# Patient Record
Sex: Male | Born: 1975
Health system: Southern US, Community
[De-identification: ages and names within clinical notes are randomized; demographics above are authoritative.]

## PROBLEM LIST (undated history)

## (undated) DIAGNOSIS — T8859XA Other complications of anesthesia, initial encounter: Secondary | ICD-10-CM

## (undated) DIAGNOSIS — S0005XA Superficial foreign body of scalp, initial encounter: Secondary | ICD-10-CM

## (undated) DIAGNOSIS — E119 Type 2 diabetes mellitus without complications: Secondary | ICD-10-CM

## (undated) DIAGNOSIS — F419 Anxiety disorder, unspecified: Secondary | ICD-10-CM

## (undated) HISTORY — DX: Anxiety disorder, unspecified: F41.9

## (undated) HISTORY — PX: DENTAL SURGERY: SHX609

## (undated) HISTORY — DX: Type 2 diabetes mellitus without complications: E11.9

---

## 2003-07-30 HISTORY — PX: ANKLE SURGERY: SHX546

## 2016-09-20 ENCOUNTER — Encounter: Payer: Self-pay | Admitting: Pediatrics

## 2016-09-20 ENCOUNTER — Ambulatory Visit (INDEPENDENT_AMBULATORY_CARE_PROVIDER_SITE_OTHER): Payer: Medicaid Other | Admitting: Pediatrics

## 2016-09-20 VITALS — BP 130/85 | HR 90 | Temp 97.6°F | Ht 71.0 in | Wt 259.2 lb

## 2016-09-20 DIAGNOSIS — E109 Type 1 diabetes mellitus without complications: Secondary | ICD-10-CM

## 2016-09-20 DIAGNOSIS — F411 Generalized anxiety disorder: Secondary | ICD-10-CM

## 2016-09-20 LAB — BAYER DCA HB A1C WAIVED: HB A1C (BAYER DCA - WAIVED): 6.9 % (ref <7.0)

## 2016-09-20 MED ORDER — INSULIN GLARGINE 100 UNIT/ML ~~LOC~~ SOLN: 30.0000 [IU] | Freq: Two times a day (BID) | SUBCUTANEOUS | 4 refills | Status: DC

## 2016-09-20 MED ORDER — GLUCOSE BLOOD VI STRP: ORAL_STRIP | 12 refills | Status: DC

## 2016-09-20 MED ORDER — ALPRAZOLAM 1 MG PO TABS: 1.0000 mg | ORAL_TABLET | Freq: Three times a day (TID) | ORAL | 2 refills | Status: DC | PRN

## 2016-09-20 MED ORDER — INSULIN LISPRO 100 UNIT/ML ~~LOC~~ SOLN: SUBCUTANEOUS | 3 refills | Status: DC

## 2016-09-20 NOTE — Progress Notes (Signed)
Subjective:   Patient ID: Gary Stone, male    DOB: September 09, 1975, 41 y.o.   MRN: [id number] CC: New Patient (Initial Visit)  HPI: Gary Stone is a 41 y.o. male presenting for New Patient (Initial Visit)  Started on insulin when diagnosed Diagnosed 41 yo Now taking lantus 30u in the morning, 30u in the evening, not changed for past few years humalog is SSI: about 5u at a time. Takes 1 unit for every 20 over 120 Last A1c about 6.7 he thinks back about 6 months ago Checks every morning and throughout the day Avoiding carbs at home Rare juices, not daily No lows recently AM BGL 70s-150  Tobacco: Ongoing use  Anxiety:  Started on xanax about 20 years ago Has been tried on wellbutirn, Brewing technologist, other SSRI Started marijuana for anxiety in his 78s, smoking heavily then When stopped it, had worsening anxiety then ended up on the xanax Up to 120 a month, now down to 28m BID for the past 8 years Sometimes takes a third in a day for panic attacks No specific triggers Thinks anxiety has been fairly well controlled  Chronic ankle pain: [facility], has screws and plate in Takes hydrocodone 10-3245mTID prn for pain prescribed there   Past Medical History:  Diagnosis Date  . Anxiety   . Diabetes [place] without complication ([place]   History reviewed. No pertinent family history. Social History   Social History  . Marital status: Married    Spouse name: N/A  . Number of children: N/A  . Years of education: N/A   Social History Main Topics  . Smoking status: Current Every Day Smoker    Packs/day: 0.25    Types: Cigarettes  . Smokeless tobacco: Never Used  . Alcohol use No  . Drug use: No  . Sexual activity: Yes   Other Topics Concern  . None   Social History Narrative  . None   ROS: All systems negative other than what is in HPI  Objective:    BP 130/85   Pulse 90   Temp 97.6 F (36.4 C) (Oral)   Ht _0  (1.803 m)    Wt 259 lb 3.2 oz (117.6 kg)   BMI 36.15 kg/m   Wt Readings from Last 3 Encounters:  09/20/16 259 lb 3.2 oz (117.6 kg)    Gen: NAD, alert, cooperative with exam, NCAT EYES: EOMI, no conjunctival injection, or no icterus ENT:  TMs pearly gray b/l, OP without erythema LYMPH: no cervical LAD CV: NRRR, normal S1/S2, no murmur, distal pulses 2+ b/l Resp: CTABL, no wheezes, normal WOB Abd: +BS, soft, NTND. no guarding or organomegaly Ext: No edema, warm Neuro: Alert and oriented, strength equal b/l UE and LE, coordination grossly normal MSK: normal muscle bulk  Assessment & Plan:  Gary Stone seen today for new patient (initial visit).  Diagnoses and all orders for this visit:  Type 1 diabetes mellitus without complication (HCC) [id number].9 Fair control Cont TID BGLs at home with SSI Needs eye exam Foot exam next visit -     CBC with Differential/Platelet -     CMP14+EGFR -     Lipid panel -     Bayer DCA Hb A1c Waived -     Microalbumin / creatinine urine ratio -     insulin glargine (LANTUS) 100 UNIT/ML injection; Inject 0.3 mLs (30 Units total) into the skin 2 (two) times daily. -     insulin  lispro (HUMALOG) 100 UNIT/ML injection; 5-20 units with meals as instructed per sliding scale -     glucose blood (ONE TOUCH ULTRA TEST) test strip; Use as instructed to check five times a day  Generalized anxiety disorder On narcotics prescribed by [facility] Discussed concern with being on both benzodiazepine and narcotic medication Has been tried on multiple meds for anxiety, has been on below regimen for multiple years in [place] state, no recent changes Open to decreasing as able and needed Will continue for now, take 2-3 times a day as needed Must be seen for refills -     ALPRAZolam (XANAX) 1 MG tablet; Take 1 tablet (1 mg total) by mouth 3 (three) times daily as needed for anxiety.   Follow up plan: Return in about 2 months (around 12/04/2016). DM2 f/u Gary Found, MD [place]

## 2016-09-21 LAB — CMP14+EGFR
ALT: 24 IU/L (ref 0–44)
AST: 25 IU/L (ref 0–40)
Albumin/Globulin Ratio: 1.7 (ref 1.2–2.2)
Albumin: 4.5 g/dL (ref 3.5–5.5)
Alkaline Phosphatase: 57 IU/L (ref 39–117)
BUN/Creatinine Ratio: 11 (ref 9–20)
BUN: 12 mg/dL (ref 6–24)
Bilirubin Total: 0.4 mg/dL (ref 0.0–1.2)
CALCIUM: 10 mg/dL (ref 8.7–10.2)
CO2: 28 mmol/L (ref 18–29)
CREATININE: 1.09 mg/dL (ref 0.76–1.27)
Chloride: 99 mmol/L (ref 96–106)
GFR calc Af Amer: 98 mL/min/{1.73_m2} (ref >59)
GFR, EST NON AFRICAN AMERICAN: 84 mL/min/{1.73_m2} (ref >59)
GLOBULIN, TOTAL: 2.6 g/dL (ref 1.5–4.5)
Glucose: 53 mg/dL — ABNORMAL LOW (ref 65–99)
Potassium: 4.2 mmol/L (ref 3.5–5.2)
SODIUM: 141 mmol/L (ref 134–144)
Total Protein: 7.1 g/dL (ref 6.0–8.5)

## 2016-09-21 LAB — CBC WITH DIFFERENTIAL/PLATELET
Basophils Absolute: 0 10*3/uL (ref 0.0–0.2)
Basos: 0 %
EOS (ABSOLUTE): 0.1 10*3/uL (ref 0.0–0.4)
EOS: 2 %
HEMATOCRIT: 44.4 % (ref 37.5–51.0)
HEMOGLOBIN: 15 g/dL (ref 13.0–17.7)
IMMATURE GRANULOCYTES: 0 %
Immature Grans (Abs): 0 10*3/uL (ref 0.0–0.1)
LYMPHS: 28 %
Lymphocytes Absolute: 2 10*3/uL (ref 0.7–3.1)
MCH: 31.1 pg (ref 26.6–33.0)
MCHC: 33.8 g/dL (ref 31.5–35.7)
MCV: 92 fL (ref 79–97)
MONOCYTES: 8 %
MONOS ABS: 0.6 10*3/uL (ref 0.1–0.9)
NEUTROS PCT: 62 %
Neutrophils Absolute: 4.5 10*3/uL (ref 1.4–7.0)
Platelets: 249 10*3/uL (ref 150–379)
RBC: 4.83 x10E6/uL (ref 4.14–5.80)
RDW: 13.7 % (ref 12.3–15.4)
WBC: 7.3 10*3/uL (ref 3.4–10.8)

## 2016-09-21 LAB — LIPID PANEL
CHOL/HDL RATIO: 5 ratio (ref 0.0–5.0)
Cholesterol, Total: 184 mg/dL (ref 100–199)
HDL: 37 mg/dL — AB (ref >39)
LDL CALC: 108 mg/dL — AB (ref 0–99)
TRIGLYCERIDES: 195 mg/dL — AB (ref 0–149)
VLDL CHOLESTEROL CAL: 39 mg/dL (ref 5–40)

## 2016-09-21 LAB — MICROALBUMIN / CREATININE URINE RATIO
Creatinine, Urine: 95.7 mg/dL
Microalb/Creat Ratio: <3.1 mg/g creat (ref 0.0–30.0)
Microalbumin, Urine: <3 ug/mL

## 2016-09-23 ENCOUNTER — Other Ambulatory Visit: Payer: Self-pay | Admitting: *Deleted

## 2016-09-23 MED ORDER — BLOOD GLUCOSE MONITOR KIT: PACK | 0 refills | Status: AC

## 2016-09-24 ENCOUNTER — Other Ambulatory Visit: Payer: Self-pay | Admitting: *Deleted

## 2016-10-11 ENCOUNTER — Telehealth: Payer: Self-pay | Admitting: Pediatrics

## 2016-10-11 DIAGNOSIS — E109 Type 1 diabetes mellitus without complications: Secondary | ICD-10-CM

## 2016-10-11 NOTE — Telephone Encounter (Signed)
What is the name of the medication? Test strips  Have you contacted your pharmacy to request a refill? Yes he has not refills he wants refills and only 100 not enough to last a month he tests 5 times a day   Which pharmacy would you like this sent to? [facility]   Patient notified that their request is being sent to the clinical staff for review and that they should receive a call once it is complete. If they do not receive a call within 24 hours they can check with their pharmacy or our office.

## 2016-10-11 NOTE — Telephone Encounter (Signed)
Done in feb for 150

## 2016-11-04 ENCOUNTER — Ambulatory Visit (INDEPENDENT_AMBULATORY_CARE_PROVIDER_SITE_OTHER): Payer: Medicaid Other | Admitting: Family Medicine

## 2016-11-04 ENCOUNTER — Encounter: Payer: Self-pay | Admitting: Family Medicine

## 2016-11-04 VITALS — BP 135/83 | HR 94 | Temp 97.6°F | Ht 71.0 in | Wt 262.0 lb

## 2016-11-04 DIAGNOSIS — S90862A Insect bite (nonvenomous), left foot, initial encounter: Secondary | ICD-10-CM | POA: Diagnosis not present

## 2016-11-04 DIAGNOSIS — J209 Acute bronchitis, unspecified: Secondary | ICD-10-CM | POA: Diagnosis not present

## 2016-11-04 DIAGNOSIS — W57XXXA Bitten or stung by nonvenomous insect and other nonvenomous arthropods, initial encounter: Secondary | ICD-10-CM

## 2016-11-04 MED ORDER — ALBUTEROL SULFATE HFA 108 (90 BASE) MCG/ACT IN AERS: 2.0000 | INHALATION_SPRAY | Freq: Four times a day (QID) | RESPIRATORY_TRACT | 0 refills | Status: DC | PRN

## 2016-11-04 MED ORDER — DOXYCYCLINE HYCLATE 100 MG PO TABS: 100.0000 mg | ORAL_TABLET | Freq: Two times a day (BID) | ORAL | 0 refills | Status: DC

## 2016-11-04 NOTE — Progress Notes (Signed)
BP 135/83   Pulse 94   Temp 97.6 F (36.4 C) (Oral)   Ht 5' 11"  (1.803 m)   Wt 262 lb (118.8 kg)   BMI 36.54 kg/m    Subjective:    Patient ID: Gary Stone, male    DOB: Aug 07, 1975, 41 y.o.   MRN: [id number]  HPI: Gary Stone is a 41 y.o. male presenting on 11/04/2016 for Cough (had amoxicillin at home, has taken); Sinusitis (runny nose, nasal congestion and pressure, headache); and Tick Removal (on left foot)   HPI  Cough and sinus congestion 41 year old male presenting with a 3 days history of cough. Patient describes his cough as coming in fits with sharp mid-sternal pain. Patient states that symptoms began Saturday and are associated with green/yellow nasal drainage, chest and sinus congestion, and green/yellow sputum as well as fever and chills. Patient has been taking left over amoxicillin and mucinex since Saturday with minimal relief. Patient is a 15 year smoker and states that he has similar episodes around once a year.   Tick bite Additionally patient complains of small red bump on his left foot, states he was bitten by a tick on the dorsal aspect of his left foot on Friday, tick was found intact and loose in his sock. He denies any arthralgias or abnormal rashes  Relevant past medical, surgical, family and social history reviewed and updated as indicated. Interim medical history since our last visit reviewed. Allergies and medications reviewed and updated.  Review of Systems  Constitutional: Negative for chills and fever.  HENT: Positive for congestion, postnasal drip, rhinorrhea, sinus pressure, sneezing and sore throat. Negative for ear discharge, ear pain and voice change.   Eyes: Negative for pain, discharge, redness and visual disturbance.  Respiratory: Positive for cough. Negative for shortness of breath and wheezing.   Cardiovascular: Negative for chest pain and leg swelling.  Musculoskeletal: Negative for gait problem.  Skin: Positive for rash.  All other  systems reviewed and are negative.   Per HPI unless specifically indicated above   Allergies as of 11/04/2016   No Known Allergies     Medication List       Accurate as of 11/04/16 12:03 PM. Always use your most recent med list.          albuterol 108 (90 Base) MCG/ACT inhaler Commonly known as:  PROVENTIL HFA;VENTOLIN HFA Inhale 2 puffs into the lungs every 6 (six) hours as needed for wheezing or shortness of breath.   ALPRAZolam 1 MG tablet Commonly known as:  XANAX Take 1 tablet (1 mg total) by mouth 3 (three) times daily as needed for anxiety.   blood glucose meter kit and supplies Kit Dispense based on patient and insurance preference. Use up to four times daily as directed. (FOR ICD-9 250.00, 250.01).   doxycycline 100 MG tablet Commonly known as:  VIBRA-TABS Take 1 tablet (100 mg total) by mouth 2 (two) times daily.   glucose blood test strip Commonly known as:  ONE TOUCH ULTRA TEST Use as instructed to check five times a day   HYDROcodone-acetaminophen 10-325 MG tablet Commonly known as:  NORCO Take 1 tablet by mouth every 8 (eight) hours as needed.   insulin glargine 100 UNIT/ML injection Commonly known as:  LANTUS Inject 0.3 mLs (30 Units total) into the skin 2 (two) times daily.   insulin lispro 100 UNIT/ML injection Commonly known as:  HUMALOG 5-20 units with meals as instructed per sliding scale   loratadine 10  MG tablet Commonly known as:  CLARITIN Take 10 mg by mouth daily.          Objective:    BP 135/83   Pulse 94   Temp 97.6 F (36.4 C) (Oral)   Ht 5' 11"  (1.803 m)   Wt 262 lb (118.8 kg)   BMI 36.54 kg/m   Wt Readings from Last 3 Encounters:  11/04/16 262 lb (118.8 kg)  09/20/16 259 lb 3.2 oz (117.6 kg)    Physical Exam  Constitutional: He is oriented to person, place, and time. He appears well-developed and well-nourished.  HENT:  Head: Normocephalic and atraumatic.  Eyes: Conjunctivae and EOM are normal. Pupils are equal,  round, and reactive to light.  Neck: Normal range of motion. Neck supple.  Cardiovascular: Normal rate, regular rhythm, normal heart sounds and intact distal pulses.  Exam reveals no gallop and no friction rub.   No murmur heard. Pulmonary/Chest: Effort normal. No respiratory distress. He has rhonchi. He has no rales.  Abdominal: Soft. Bowel sounds are normal.  Musculoskeletal: Normal range of motion.  Neurological: He is alert and oriented to person, place, and time.  Skin: Skin is warm and dry.  Small 60m red papule located on dorsal aspect of left foot. No streaking, warmth, pain, or discharge.  Psychiatric: He has a normal mood and affect. His behavior is normal.          Assessment & Plan:   Problem List Items Addressed This Visit    None    Visit Diagnoses    Acute bronchitis, unspecified organism    -  Primary   Relevant Medications   doxycycline (VIBRA-TABS) 100 MG tablet   albuterol (PROVENTIL HFA;VENTOLIN HFA) 108 (90 Base) MCG/ACT inhaler   Tick bite of left foot, initial encounter       Relevant Medications   doxycycline (VIBRA-TABS) 100 MG tablet   albuterol (PROVENTIL HFA;VENTOLIN HFA) 108 (90 Base) MCG/ACT inhaler      Discussed diagnosis of Acute Bronchitis with patient. Informed patient of possible that due to smoking history that he could be developing early Emphysema which could be contributing to this illness. Smoking cessation was offered to patient but he wants to quit on his own.  Patient seen and examined with BNuala AlphaPA student. Agree with above assessment and plan. JCaryl Pina MD [facility] 11/04/2016, 2:06 PM     Follow up plan: Return if symptoms worsen or fail to improve.  Counseling provided for all of the vaccine components No orders of the defined types were placed in this encounter.

## 2016-12-18 ENCOUNTER — Ambulatory Visit: Payer: Medicaid Other | Admitting: Pediatrics

## 2016-12-20 ENCOUNTER — Ambulatory Visit (INDEPENDENT_AMBULATORY_CARE_PROVIDER_SITE_OTHER): Payer: Medicaid Other | Admitting: Pediatrics

## 2016-12-20 ENCOUNTER — Encounter: Payer: Self-pay | Admitting: Pediatrics

## 2016-12-20 VITALS — BP 125/87 | HR 93 | Temp 97.9°F | Ht 71.0 in | Wt 263.0 lb

## 2016-12-20 DIAGNOSIS — F411 Generalized anxiety disorder: Secondary | ICD-10-CM

## 2016-12-20 DIAGNOSIS — G8929 Other chronic pain: Secondary | ICD-10-CM

## 2016-12-20 DIAGNOSIS — E109 Type 1 diabetes mellitus without complications: Secondary | ICD-10-CM | POA: Diagnosis not present

## 2016-12-20 DIAGNOSIS — M25571 Pain in right ankle and joints of right foot: Secondary | ICD-10-CM

## 2016-12-20 MED ORDER — INSULIN LISPRO 100 UNIT/ML ~~LOC~~ SOLN: SUBCUTANEOUS | 3 refills | Status: DC

## 2016-12-20 MED ORDER — INSULIN GLARGINE 100 UNIT/ML ~~LOC~~ SOLN: 30.0000 [IU] | Freq: Two times a day (BID) | SUBCUTANEOUS | 4 refills | Status: DC

## 2016-12-20 MED ORDER — ALPRAZOLAM 1 MG PO TABS: 1.0000 mg | ORAL_TABLET | Freq: Three times a day (TID) | ORAL | 2 refills | Status: DC | PRN

## 2016-12-20 MED ORDER — ONETOUCH ULTRASOFT LANCETS MISC: 12 refills | Status: DC

## 2016-12-20 MED ORDER — GLUCOSE BLOOD VI STRP: ORAL_STRIP | 12 refills | Status: DC

## 2016-12-20 MED ORDER — "NEEDLE (DISP) 27G X 1/2"" MISC": 1.0000 | Freq: Two times a day (BID) | 2 refills | Status: DC

## 2016-12-20 NOTE — Progress Notes (Signed)
  Subjective:   Patient ID: Gary Stone, male    DOB: 05-07-1976, 41 y.o.   MRN: [id number] CC: Follow-up (3 month) DM1 HPI: [name] is a 41 y.o. male presenting for Follow-up (3 month)  DM1: BGLs some lows to 50 Usually 80-120 Sometimes having  Does carb counting and SSI, for every 20 over 150 takes 1 unit Rare BGLs over 200  Anxiety: symptoms controlled Tried on multiple meds in the past, now on xanax, takes twice a day, sometimes 3 times a day when  Sleeping fine  Had 3 surgeries on R ankle, still with hardware Was offered ankle fusion for pain in [place] state before moving here Doesn't want the limitations in mobility Following now at [facility] now for chronic pain   Relevant past medical, surgical, family and social history reviewed. Allergies and medications reviewed and updated. History  Smoking Status  . Current Every Day Smoker  . Packs/day: 0.25  . Types: Cigarettes  Smokeless Tobacco  . Never Used   ROS: Per HPI   Objective:    BP 125/87   Pulse 93   Temp 97.9 F (36.6 C) (Oral)   Ht '5\' 11"'$  (1.803 m)   Wt 263 lb (119.3 kg)   BMI 36.68 kg/m   Wt Readings from Last 3 Encounters:  12/20/16 263 lb (119.3 kg)  11/04/16 262 lb (118.8 kg)  09/20/16 259 lb 3.2 oz (117.6 kg)    Gen: NAD, alert, cooperative with exam, NCAT EYES: EOMI, no conjunctival injection, or no icterus CV: NRRR, normal S1/S2, no murmur, distal pulses 2+ b/l Resp: CTABL, no wheezes, normal WOB Ext: No edema, warm Neuro: Alert and oriented MSK: normal muscle bulk  Assessment & Plan:  Ellis was seen today for follow-up.  Diagnoses and all orders for this visit:  Type 1 diabetes mellitus without complication (HCC) Stable, well controlled Cont current insulin dosing, 30u glargine BID Carb counting plus SSI with meals Checking BGLs throughout the day -     Lancets (ONETOUCH ULTRASOFT) lancets; Use as instructed -     glucose blood (ONE TOUCH ULTRA TEST) test  strip; Use as instructed to check six times a day -     insulin lispro (HUMALOG) 100 UNIT/ML injection; 5-20 units with meals as instructed per sliding scale -     insulin glargine (LANTUS) 100 UNIT/ML injection; Inject 0.3 mLs (30 Units total) into the skin 2 (two) times daily. -     NEEDLE, DISP, 27 G (BD DISP NEEDLES) 27G X 1/2" MISC; 1 each by Does not apply route 2 (two) times daily. -     Bayer DCA Hb A1c Waived -     BMP8+EGFR  Generalized anxiety disorder Stable, well controlled Mood has been fine Taking BID, sometimes third time when needed, #80 tabs with 2 refills given Continue to discuss risk of being on multiple meds that cause sedation including xanax and narcotics. Pt expressed understanding. Has been on stable dose of both for several years. -     ALPRAZolam (XANAX) 1 MG tablet; Take 1 tablet (1 mg total) by mouth 3 (three) times daily as needed for anxiety.  Chronic pain of right ankle Following at [facility] for chronic ankle pain Interested in second opinion, will refer to [facility] -     Ambulatory referral to Pain Clinic  Follow up plan: Return in about 3 months (around 03/22/2017). [name], MD [place]

## 2017-01-08 ENCOUNTER — Other Ambulatory Visit: Payer: Self-pay | Admitting: Pediatrics

## 2017-01-08 DIAGNOSIS — F411 Generalized anxiety disorder: Secondary | ICD-10-CM

## 2017-01-08 NOTE — Telephone Encounter (Signed)
[id number] 06/13

## 2017-01-08 NOTE — Telephone Encounter (Signed)
He had Rx with 2 refills given 5/25, should not need new Rx

## 2017-01-14 NOTE — Telephone Encounter (Signed)
Print had printed Rx in truck, [facility]

## 2017-03-25 ENCOUNTER — Encounter: Payer: Self-pay | Admitting: Pediatrics

## 2017-03-25 ENCOUNTER — Ambulatory Visit (INDEPENDENT_AMBULATORY_CARE_PROVIDER_SITE_OTHER): Payer: Medicaid Other | Admitting: Pediatrics

## 2017-03-25 VITALS — BP 138/89 | HR 82 | Temp 97.2°F | Ht 71.0 in | Wt 260.4 lb

## 2017-03-25 DIAGNOSIS — F411 Generalized anxiety disorder: Secondary | ICD-10-CM | POA: Diagnosis not present

## 2017-03-25 DIAGNOSIS — J309 Allergic rhinitis, unspecified: Secondary | ICD-10-CM | POA: Insufficient documentation

## 2017-03-25 DIAGNOSIS — E1069 Type 1 diabetes mellitus with other specified complication: Secondary | ICD-10-CM | POA: Insufficient documentation

## 2017-03-25 DIAGNOSIS — E785 Hyperlipidemia, unspecified: Secondary | ICD-10-CM | POA: Diagnosis not present

## 2017-03-25 DIAGNOSIS — R350 Frequency of micturition: Secondary | ICD-10-CM

## 2017-03-25 DIAGNOSIS — G8929 Other chronic pain: Secondary | ICD-10-CM

## 2017-03-25 DIAGNOSIS — E109 Type 1 diabetes mellitus without complications: Secondary | ICD-10-CM | POA: Diagnosis not present

## 2017-03-25 DIAGNOSIS — J3089 Other allergic rhinitis: Secondary | ICD-10-CM

## 2017-03-25 LAB — URINALYSIS, COMPLETE
Bilirubin, UA: NEGATIVE
Glucose, UA: NEGATIVE
KETONES UA: NEGATIVE
Leukocytes, UA: NEGATIVE
NITRITE UA: NEGATIVE
PH UA: 5 (ref 5.0–7.5)
Protein, UA: NEGATIVE
RBC UA: NEGATIVE
SPEC GRAV UA: 1.02 (ref 1.005–1.030)
Urobilinogen, Ur: 0.2 mg/dL (ref 0.2–1.0)

## 2017-03-25 LAB — MICROSCOPIC EXAMINATION
Bacteria, UA: NONE SEEN
EPITHELIAL CELLS (NON RENAL): NONE SEEN /HPF (ref 0–10)
RBC, UA: NONE SEEN /hpf (ref 0–?)
RENAL EPITHEL UA: NONE SEEN /HPF
WBC, UA: NONE SEEN /hpf (ref 0–?)

## 2017-03-25 LAB — BAYER DCA HB A1C WAIVED: HB A1C: 6.9 % (ref <7.0)

## 2017-03-25 MED ORDER — INSULIN LISPRO 100 UNIT/ML ~~LOC~~ SOLN: SUBCUTANEOUS | 3 refills | Status: DC

## 2017-03-25 MED ORDER — PRAVASTATIN SODIUM 40 MG PO TABS: 40.0000 mg | ORAL_TABLET | Freq: Every day | ORAL | 3 refills | Status: DC

## 2017-03-25 MED ORDER — INSULIN GLARGINE 100 UNIT/ML ~~LOC~~ SOLN: 30.0000 [IU] | Freq: Two times a day (BID) | SUBCUTANEOUS | 4 refills | Status: DC

## 2017-03-25 MED ORDER — ALPRAZOLAM 1 MG PO TABS: 1.0000 mg | ORAL_TABLET | Freq: Three times a day (TID) | ORAL | 2 refills | Status: DC | PRN

## 2017-03-25 MED ORDER — LORATADINE 10 MG PO TABS: 10.0000 mg | ORAL_TABLET | Freq: Every day | ORAL | 11 refills | Status: DC

## 2017-03-25 NOTE — Progress Notes (Signed)
  Subjective:   Patient ID: Gary Stone, male    DOB: Feb 11, 1976, 41 y.o.   MRN: [id number] CC: Follow-up (3 month) diabetes, anxiety HPI: Gary Stone is a 41 y.o. male presenting for Follow-up (3 month)  Anxiety: taking xanax 2-3 times a day Still with good and bad days, mostly good days  DM1: BGLs have been normal, highest past few days 165 132 this morning Taking insulin regularly  Tobacco use: taking chantix, has been pleased with effect Trying to quit Has had half pack in 3 days Was doing 1 ppd  Has had some urinary frequency past couple of days  Following with pain clinic for chronic pain  Relevant past medical, surgical, family and social history reviewed. Allergies and medications reviewed and updated. History  Smoking Status  . Current Every Day Smoker  . Packs/day: 0.25  . Types: Cigarettes  Smokeless Tobacco  . Never Used   ROS: Per HPI   Objective:    BP 138/89   Pulse 82   Temp (!) 97.2 F (36.2 C) (Oral)   Ht 5\' 11"  (1.803 m)   Wt 260 lb 6.4 oz (118.1 kg)   BMI 36.32 kg/m   Wt Readings from Last 3 Encounters:  03/25/17 260 lb 6.4 oz (118.1 kg)  12/20/16 263 lb (119.3 kg)  11/04/16 262 lb (118.8 kg)    Gen: NAD, alert, cooperative with exam, NCAT EYES: EOMI, no conjunctival injection, or no icterus ENT:  TMs pearly gray b/l, OP without erythema LYMPH: no cervical LAD CV: NRRR, normal S1/S2, no murmur, distal pulses 2+ b/l Resp: CTABL, no wheezes, normal WOB Abd: +BS, soft, NTND.  Ext: No edema, warm Neuro: Alert and oriented MSK: normal muscle bulk  Assessment & Plan:  Miles was seen today for follow-up multiple med problems.  Diagnoses and all orders for this visit:  Type 1 diabetes mellitus without complication (HCC) A1c 6.9, same as last visit Cont current meds -     Bayer DCA Hb A1c Waived -     insulin glargine (LANTUS) 100 UNIT/ML injection; Inject 0.3 mLs (30 Units total) into the skin 2 (two) times daily. -     insulin  lispro (HUMALOG) 100 UNIT/ML injection; 5-20 units with meals as instructed per sliding scale  Generalized anxiety disorder Stable symptoms on below dosing Also on chronic narcotic therapy, followed at pain clinic Takes 1 mg 2-3 times a day Discussed risk of concomitant benzo with narcotic therapy Other treatments for anxiety have failed in the past Will continue below, cont to assess need  -     ALPRAZolam (XANAX) 1 MG tablet; Take 1 tablet (1 mg total) by mouth 3 (three) times daily as needed for anxiety.  Hyperlipidemia, unspecified hyperlipidemia type Start below -     pravastatin (PRAVACHOL) 40 MG tablet; Take 1 tablet (40 mg total) by mouth daily.  Urinary frequency -     Urinalysis, Complete  Allergic rhinitis due to other allergic trigger, unspecified seasonality -     loratadine (CLARITIN) 10 MG tablet; Take 1 tablet (10 mg total) by mouth daily.   Follow up plan: Return in about 3 months (around 06/25/2017). Gary Kras, MD Gary Stone [facility]

## 2017-04-09 ENCOUNTER — Other Ambulatory Visit: Payer: Self-pay | Admitting: Pediatrics

## 2017-04-09 DIAGNOSIS — E109 Type 1 diabetes mellitus without complications: Secondary | ICD-10-CM

## 2017-04-11 ENCOUNTER — Telehealth: Payer: Self-pay | Admitting: Pediatrics

## 2017-04-11 ENCOUNTER — Other Ambulatory Visit: Payer: Self-pay

## 2017-04-11 DIAGNOSIS — E109 Type 1 diabetes mellitus without complications: Secondary | ICD-10-CM

## 2017-04-11 MED ORDER — INSULIN GLARGINE 100 UNIT/ML ~~LOC~~ SOLN: 30.0000 [IU] | Freq: Two times a day (BID) | SUBCUTANEOUS | 4 refills | Status: DC

## 2017-04-11 NOTE — Telephone Encounter (Signed)
Is this ok to change? Please advise and route to Pool B

## 2017-04-11 NOTE — Telephone Encounter (Signed)
Aware, thanks!

## 2017-04-11 NOTE — Telephone Encounter (Signed)
Patient called office and stated that he has plenty of Humalog and does not want it changed at this time. He did need a refill on Lantus. Looks like it was sent to wrong pharmacy. I sent in to correct pharmacy per his request. He will check with his insurance and get back with [place] about Humalog.

## 2017-04-29 ENCOUNTER — Encounter: Payer: Self-pay | Admitting: Family Medicine

## 2017-04-29 ENCOUNTER — Ambulatory Visit (INDEPENDENT_AMBULATORY_CARE_PROVIDER_SITE_OTHER): Payer: BLUE CROSS/BLUE SHIELD | Admitting: Family Medicine

## 2017-04-29 ENCOUNTER — Encounter: Payer: Self-pay | Admitting: *Deleted

## 2017-04-29 VITALS — BP 124/81 | HR 91 | Temp 98.2°F | Ht 71.0 in | Wt 261.0 lb

## 2017-04-29 DIAGNOSIS — J029 Acute pharyngitis, unspecified: Secondary | ICD-10-CM | POA: Diagnosis not present

## 2017-04-29 LAB — RAPID STREP SCREEN (MED CTR MEBANE ONLY): STREP GP A AG, IA W/REFLEX: NEGATIVE

## 2017-04-29 LAB — CULTURE, GROUP A STREP

## 2017-04-29 NOTE — Progress Notes (Signed)
Subjective:    Patient ID: Gary Stone, male    DOB: 1976/07/15, 41 y.o.   MRN: [id number]  HPI Patient here today for cough, dry throat and allergies. He does have a history of seasonal allergies. There is been no fever. He has not tried any OTC meds.       Patient Active Problem List   Diagnosis Date Noted  . Allergic rhinitis 03/25/2017  . Hyperlipidemia 03/25/2017  . Chronic pain 03/25/2017  . Type 1 diabetes mellitus without complication ([place]) 02/54/2706  . Generalized anxiety disorder 12/20/2016   Outpatient Encounter Prescriptions as of 04/29/2017  Medication Sig  . ALPRAZolam (XANAX) 1 MG tablet Take 1 tablet (1 mg total) by mouth 3 (three) times daily as needed for anxiety.  . B-D INS SYRINGE 0.5CC/30GX1/2" 30G X 1/2" 0.5 ML MISC 1 EACH BY DOES NOT APPLY ROUTE 2 (TWO) TIMES DAILY.  . blood glucose meter kit and supplies KIT Dispense based on patient and insurance preference. Use up to four times daily as directed. (FOR ICD-9 250.00, 250.01).  Hendricks Limes CONTINUING MONTH PAK 1 MG tablet Take 1 mg by mouth daily.  Marland Kitchen gabapentin (NEURONTIN) 600 MG tablet Take 600 mg by mouth 3 (three) times daily.  Marland Kitchen glucose blood (ONE TOUCH ULTRA TEST) test strip Use as instructed to check six times a day  . HUMALOG 100 UNIT/ML injection TAKE 5-20 UNITS WITH MEALS AS INSTRUCTED PER SLIDING SCALE  . HYDROcodone-acetaminophen (NORCO) 10-325 MG tablet Take 1 tablet by mouth every 8 (eight) hours as needed.  . insulin glargine (LANTUS) 100 UNIT/ML injection Inject 0.3 mLs (30 Units total) into the skin 2 (two) times daily.  . Insulin Syringe-Needle U-100 (B-D INS SYRINGE 0.5CC/30GX1/2") 30G X 1/2" 0.5 ML MISC 1 each by Does not apply route 2 (two) times daily.  . Lancets (ONETOUCH ULTRASOFT) lancets Use as instructed  . loratadine (CLARITIN) 10 MG tablet Take 1 tablet (10 mg total) by mouth daily.  Marland Kitchen morphine (MS CONTIN) 15 MG 12 hr tablet Take 15 mg by mouth every 12 (twelve) hours.  .  pravastatin (PRAVACHOL) 40 MG tablet Take 1 tablet (40 mg total) by mouth daily.  . [DISCONTINUED] insulin lispro (HUMALOG) 100 UNIT/ML injection 5-20 units with meals as instructed per sliding scale   No facility-administered encounter medications on file as of 04/29/2017.      Review of Systems  Constitutional: Positive for fatigue and fever (warm last night ).  HENT: Positive for congestion. Sore throat: dry throat and swollen lymphnodes.        Allergies  Eyes: Negative.   Respiratory: Positive for cough.   Cardiovascular: Negative.   Gastrointestinal: Negative.   Endocrine: Negative.   Genitourinary: Negative.   Musculoskeletal: Negative.   Skin: Negative.   Allergic/Immunologic: Negative.   Neurological: Negative.   Hematological: Negative.   Psychiatric/Behavioral: Negative.        Objective:   Physical Exam  Constitutional: He appears well-developed and well-nourished.  HENT:  Right Ear: External ear normal.  Left Ear: External ear normal.  Nose: Nose normal.  Mouth/Throat: Oropharyngeal exudate present.  Cardiovascular: Normal rate and regular rhythm.   Pulmonary/Chest: Effort normal.   BP 124/81 (BP Location: Left Arm)   Pulse 91   Temp 98.2 F (36.8 C) (Oral)   Ht 5' 11"  (1.803 m)   Wt 261 lb (118.4 kg)   BMI 36.40 kg/m   Rapid strep test is negative      Assessment & Plan:  Viral pharyngitis and seasonal rhinitis. I recommended OTC antihistamine such as Allegra. Also recommend salt water gargles and lozenges for sore throat. I explained that antibiotics would not help in this situation.

## 2017-04-30 ENCOUNTER — Other Ambulatory Visit: Payer: Self-pay | Admitting: Pediatrics

## 2017-04-30 NOTE — Telephone Encounter (Signed)
Since patient is no better and is still having lots of throat pain and sore throat please call prescription in for amoxicillin 500 3 times daily to take for 10 days

## 2017-04-30 NOTE — Telephone Encounter (Signed)
[facility] aware [facility]

## 2017-04-30 NOTE — Telephone Encounter (Signed)
Please review and advise.

## 2017-05-02 ENCOUNTER — Telehealth: Payer: Self-pay

## 2017-05-02 NOTE — Telephone Encounter (Signed)
Dr Andree Coss patient   Insurance denied prior auth for Humalog   Preferred are Novolin or [employer]

## 2017-05-08 MED ORDER — INSULIN ASPART 100 UNIT/ML ~~LOC~~ SOLN: SUBCUTANEOUS | 99 refills | Status: DC

## 2017-05-08 NOTE — Addendum Note (Signed)
Addended by: Johna Sheriff on: 05/08/2017 11:26 AM   Modules accepted: Orders

## 2017-05-08 NOTE — Telephone Encounter (Signed)
Stop humalog, not covered by insurance. Sent in novolog. Can you let pt know? Should dose similarly to the humalog. Tried to reach him, mailbox full.

## 2017-05-09 ENCOUNTER — Telehealth: Payer: Self-pay | Admitting: Pediatrics

## 2017-05-09 NOTE — Telephone Encounter (Signed)
Pt aware insurance denied prior authorization denied Novolog sent to [facility] yesterday [facility], updated the system removing [facility] from his lists

## 2017-05-14 NOTE — Telephone Encounter (Signed)
Attempts have been made to contact pt without return call, will close encounter. 

## 2017-06-02 ENCOUNTER — Ambulatory Visit (INDEPENDENT_AMBULATORY_CARE_PROVIDER_SITE_OTHER): Payer: BLUE CROSS/BLUE SHIELD | Admitting: Family Medicine

## 2017-06-02 ENCOUNTER — Encounter: Payer: Self-pay | Admitting: Family Medicine

## 2017-06-02 VITALS — BP 138/82 | HR 88 | Temp 97.5°F | Ht 71.0 in | Wt 261.6 lb

## 2017-06-02 DIAGNOSIS — J0100 Acute maxillary sinusitis, unspecified: Secondary | ICD-10-CM

## 2017-06-02 MED ORDER — BENZONATATE 100 MG PO CAPS: 100.0000 mg | ORAL_CAPSULE | Freq: Two times a day (BID) | ORAL | 0 refills | Status: DC | PRN

## 2017-06-02 MED ORDER — FLUTICASONE PROPIONATE 50 MCG/ACT NA SUSP: 2.0000 | Freq: Every day | NASAL | 6 refills | Status: DC

## 2017-06-02 MED ORDER — CEFDINIR 300 MG PO CAPS: 300.0000 mg | ORAL_CAPSULE | Freq: Two times a day (BID) | ORAL | 0 refills | Status: DC

## 2017-06-02 NOTE — Progress Notes (Signed)
   HPI  Patient presents today here with illness.  Patient explains that he has been ill for about 1 month.  He was initially seen with lymphadenopathy and felt to have viral illness.  He then took a course of amoxicillin with almost complete improvement in lingering illness progressing past that.  Over the last few days he has had worsening symptoms including sore throat, cough, runny nose, and facial pressure.  The patient states that he has a recurrent issue like this about once a year. Tessalon has been very helpful in the past. Doxycycline has not helped much for previous tick bite.  PMH: Smoking status noted ROS: Per HPI  Objective: BP 138/82   Pulse 88   Temp (!) 97.5 F (36.4 C) (Oral)   Ht 5\' 11"  (1.803 m)   Wt 261 lb 9.6 oz (118.7 kg)   BMI 36.49 kg/m  Gen: NAD, alert, cooperative with exam HEENT: NCAT, right-sided maxillary tenderness to palpation, oropharynx moist and clear, nares with swollen turbinates CV: RRR, good S1/S2, no murmur Resp: CTABL, no wheezes, non-labored Abd: SNTND, BS present, no guarding or organomegaly Ext: No edema, warm Neuro: Alert and oriented, No gross deficits  Assessment and plan:  #Acute maxillary sinusitis Patient with persistent cough and some evidence of sinusitis Omnicef given with recent amoxicillin course, this is broad enough to treat most pulmonary infections.  likely postnasal drip related cough, flonase sent ( however after the visit states he has tried that)    Meds ordered this encounter  Medications  . benzonatate (TESSALON) 100 MG capsule    Sig: Take 1 capsule (100 mg total) 2 (two) times daily as needed by mouth for cough.    Dispense:  20 capsule    Refill:  0  . cefdinir (OMNICEF) 300 MG capsule    Sig: Take 1 capsule (300 mg total) 2 (two) times daily by mouth. 1 po BID    Dispense:  20 capsule    Refill:  0  . fluticasone (FLONASE) 50 MCG/ACT nasal spray    Sig: Place 2 sprays daily into both nostrils.   Dispense:  16 g    Refill:  6    Murtis SinkSam Odessie Polzin, MD [facility] 06/02/2017, 1:12 PM

## 2017-06-02 NOTE — Patient Instructions (Signed)
Great to meet you!   Sinusitis, Adult Sinusitis is soreness and inflammation of your sinuses. Sinuses are hollow spaces in the bones around your face. They are located:  Around your eyes.  In the middle of your forehead.  Behind your nose.  In your cheekbones.  Your sinuses and nasal passages are lined with a stringy fluid (mucus). Mucus normally drains out of your sinuses. When your nasal tissues get inflamed or swollen, the mucus can get trapped or blocked so air cannot flow through your sinuses. This lets bacteria, viruses, and funguses grow, and that leads to infection. Follow these instructions at home: Medicines  Take, use, or apply over-the-counter and prescription medicines only as told by your doctor. These may include nasal sprays.  If you were prescribed an antibiotic medicine, take it as told by your doctor. Do not stop taking the antibiotic even if you start to feel better. Hydrate and Humidify  Drink enough water to keep your pee (urine) clear or pale yellow.  Use a cool mist humidifier to keep the humidity level in your home above 50%.  Breathe in steam for 10-15 minutes, 3-4 times a day or as told by your doctor. You can do this in the bathroom while a hot shower is running.  Try not to spend time in cool or dry air. Rest  Rest as much as possible.  Sleep with your head raised (elevated).  Make sure to get enough sleep each night. General instructions  Put a warm, moist washcloth on your face 3-4 times a day or as told by your doctor. This will help with discomfort.  Wash your hands often with soap and water. If there is no soap and water, use hand sanitizer.  Do not smoke. Avoid being around people who are smoking (secondhand smoke).  Keep all follow-up visits as told by your doctor. This is important. Contact a doctor if:  You have a fever.  Your symptoms get worse.  Your symptoms do not get better within 10 days. Get help right away if:  You  have a very bad headache.  You cannot stop throwing up (vomiting).  You have pain or swelling around your face or eyes.  You have trouble seeing.  You feel confused.  Your neck is stiff.  You have trouble breathing. This information is not intended to replace advice given to you by your health care provider. Make sure you discuss any questions you have with your health care provider. Document Released: 01/01/2008 Document Revised: 03/10/2016 Document Reviewed: 05/10/2015 Elsevier Interactive Patient Education  2018 Elsevier Inc.  

## 2017-07-04 ENCOUNTER — Telehealth: Payer: Self-pay | Admitting: Pediatrics

## 2017-07-04 MED ORDER — BAYER CONTOUR NEXT USB MONITOR W/DEVICE KIT: PACK | 0 refills | Status: DC

## 2017-07-04 NOTE — Telephone Encounter (Signed)
Patient needed a new rx for bs meter. Rx sent to pharmacy for patient

## 2017-07-08 ENCOUNTER — Other Ambulatory Visit: Payer: Self-pay | Admitting: *Deleted

## 2017-07-08 DIAGNOSIS — E109 Type 1 diabetes mellitus without complications: Secondary | ICD-10-CM

## 2017-07-08 MED ORDER — GLUCOSE BLOOD VI STRP: ORAL_STRIP | 12 refills | Status: DC

## 2017-07-09 ENCOUNTER — Other Ambulatory Visit: Payer: Self-pay

## 2017-07-09 MED ORDER — GLUCOSE BLOOD VI STRP: ORAL_STRIP | 12 refills | Status: DC

## 2017-07-11 ENCOUNTER — Other Ambulatory Visit: Payer: Self-pay | Admitting: *Deleted

## 2017-07-11 DIAGNOSIS — E109 Type 1 diabetes mellitus without complications: Secondary | ICD-10-CM

## 2017-07-11 MED ORDER — GLUCOSE BLOOD VI STRP: ORAL_STRIP | 12 refills | Status: DC

## 2017-07-23 ENCOUNTER — Telehealth: Payer: Self-pay | Admitting: Pediatrics

## 2017-07-23 ENCOUNTER — Other Ambulatory Visit: Payer: Self-pay | Admitting: Family Medicine

## 2017-07-23 DIAGNOSIS — F411 Generalized anxiety disorder: Secondary | ICD-10-CM

## 2017-07-23 MED ORDER — ALPRAZOLAM 1 MG PO TABS: 1.0000 mg | ORAL_TABLET | Freq: Three times a day (TID) | ORAL | 0 refills | Status: DC | PRN

## 2017-07-23 NOTE — Telephone Encounter (Signed)
I entered a scrip for 30. Please call in. WS

## 2017-07-23 NOTE — Telephone Encounter (Signed)
ALPRAZolam (XANAX) 1 MG tablet Pt states he never has an appointment to get this refilled. Dr Oswaldo DoneVincent Just refills it when the Pharmacy sends it over he states no one has ever told him about this office policy and he needs it refilled now. [facility] [place].

## 2017-07-23 NOTE — Telephone Encounter (Signed)
Pt requesting refill of Xanax Pt states he was never made aware of narcotic policy Has appt scheduled on 08/01/2016 with Dr Christia ReadingVincentsta Will be out of meds before appt Please review and advise

## 2017-07-23 NOTE — Telephone Encounter (Signed)
Rx phoned in.   

## 2017-07-30 ENCOUNTER — Ambulatory Visit (INDEPENDENT_AMBULATORY_CARE_PROVIDER_SITE_OTHER): Payer: Worker's Compensation | Admitting: Family Medicine

## 2017-07-30 ENCOUNTER — Encounter: Payer: Self-pay | Admitting: Family Medicine

## 2017-07-30 ENCOUNTER — Ambulatory Visit (INDEPENDENT_AMBULATORY_CARE_PROVIDER_SITE_OTHER): Payer: Worker's Compensation

## 2017-07-30 VITALS — BP 129/78 | HR 91 | Temp 97.7°F | Ht 71.0 in | Wt 261.0 lb

## 2017-07-30 DIAGNOSIS — M25532 Pain in left wrist: Secondary | ICD-10-CM

## 2017-07-30 DIAGNOSIS — M654 Radial styloid tenosynovitis [de Quervain]: Secondary | ICD-10-CM

## 2017-07-30 MED ORDER — PREDNISONE 10 MG PO TABS: ORAL_TABLET | ORAL | 0 refills | Status: DC

## 2017-07-30 NOTE — Progress Notes (Signed)
Chief Complaint  Patient presents with  . workers Compensation    pt here today c/o left wrist pain from injury that happened in July at work but it hasn't gotten any better.     HPI  Patient presents today for pain at the left wrist.  He points to the region of the radial styloid and the anatomic snuffbox.  He had an injury at work several months ago.  It was a contusion type injury.  He filed paperwork at that time for workers comp but decided not to seek medical care since it seemed mild.  Symptoms waxed and waned over the last several months it seemed that every time it was about to go away it would come back.  He continued normal use of the hand as well.  Unfortunately over the last several weeks it has continually gotten worse without any type of reinjury noted.  Ice heat rest etc. have not been helpful.  PMH: Smoking status noted ROS: Review of Systems  Constitutional: Negative for chills, diaphoresis and fever.  HENT: Negative for sore throat.   Cardiovascular: Negative for chest pain.  Gastrointestinal: Negative for abdominal pain.  Musculoskeletal: Positive for arthralgias and myalgias. Negative for back pain, gait problem and neck pain.  Skin: Negative for rash.  Neurological: Negative for weakness and numbness.    Objective: BP 129/78   Pulse 91   Temp 97.7 F (36.5 C) (Oral)   Ht 5\' 11"  (1.803 m)   Wt 261 lb (118.4 kg)   BMI 36.40 kg/m  Gen: NAD, alert, cooperative with exam HEENT: NCAT, EOMI, PERRL CV: RRR, good S1/S2, no murmur Resp: CTABL, no wheezes, non-labored Abd: SNTND, BS present, no guarding or organomegaly Ext: No edema, warm.  He is exquisitely tender at the left radial styloid.  The anatomic snuff box is mildly tender as well.  There is full range of motion for the left upper extremity it is neurovascularly intact.  There is tenderness for abduction of the wrist and hand. Neuro: Alert and oriented, No gross deficits X-ray left wrist: No acute or  convalescent fracture identified.  No dislocation. Assessment and plan:  1. De Quervain's tenosynovitis, left   2. Left wrist pain     Meds ordered this encounter  Medications  . predniSONE (DELTASONE) 10 MG tablet    Sig: Take 5 daily for 3 days followed by 4,3,2 and 1 for 3 days each.    Dispense:  45 tablet    Refill:  0    Orders Placed This Encounter  Procedures  . DG Wrist Complete Left    Standing Status:   Future    Number of Occurrences:   1    Standing Expiration Date:   09/29/2018    Order Specific Question:   Reason for Exam (SYMPTOM  OR DIAGNOSIS REQUIRED)    Answer:   Wrist pain    Order Specific Question:   Preferred imaging location?    Answer:   Internal    Follow-up will be in 2 weeks.  In the meantime he should wear a thumb spica splint and medication should be taken as noted above.  He should limit his grip and his wrist flexion to 5 pounds of force on the occasion only.  Mechele ClaudeWarren Maui Britten, MD

## 2017-08-01 ENCOUNTER — Ambulatory Visit (INDEPENDENT_AMBULATORY_CARE_PROVIDER_SITE_OTHER): Payer: BLUE CROSS/BLUE SHIELD | Admitting: Pediatrics

## 2017-08-01 ENCOUNTER — Encounter: Payer: Self-pay | Admitting: Pediatrics

## 2017-08-01 VITALS — BP 129/73 | HR 84 | Temp 97.5°F | Ht 71.0 in | Wt 262.6 lb

## 2017-08-01 DIAGNOSIS — Z0000 Encounter for general adult medical examination without abnormal findings: Secondary | ICD-10-CM | POA: Diagnosis not present

## 2017-08-01 DIAGNOSIS — E109 Type 1 diabetes mellitus without complications: Secondary | ICD-10-CM

## 2017-08-01 DIAGNOSIS — E785 Hyperlipidemia, unspecified: Secondary | ICD-10-CM

## 2017-08-01 DIAGNOSIS — F411 Generalized anxiety disorder: Secondary | ICD-10-CM

## 2017-08-01 LAB — CMP14+EGFR
A/G RATIO: 1.6 (ref 1.2–2.2)
ALK PHOS: 62 IU/L (ref 39–117)
ALT: 20 IU/L (ref 0–44)
AST: 23 IU/L (ref 0–40)
Albumin: 4.1 g/dL (ref 3.5–5.5)
BILIRUBIN TOTAL: 0.3 mg/dL (ref 0.0–1.2)
BUN / CREAT RATIO: 16 (ref 9–20)
BUN: 16 mg/dL (ref 6–24)
CHLORIDE: 100 mmol/L (ref 96–106)
CO2: 23 mmol/L (ref 20–29)
Calcium: 9.4 mg/dL (ref 8.7–10.2)
Creatinine, Ser: 0.99 mg/dL (ref 0.76–1.27)
GFR calc Af Amer: 109 mL/min/{1.73_m2} (ref >59)
GFR calc non Af Amer: 94 mL/min/{1.73_m2} (ref >59)
Globulin, Total: 2.6 g/dL (ref 1.5–4.5)
Glucose: 206 mg/dL — ABNORMAL HIGH (ref 65–99)
POTASSIUM: 4.1 mmol/L (ref 3.5–5.2)
Sodium: 139 mmol/L (ref 134–144)
Total Protein: 6.7 g/dL (ref 6.0–8.5)

## 2017-08-01 LAB — LIPID PANEL
CHOLESTEROL TOTAL: 181 mg/dL (ref 100–199)
Chol/HDL Ratio: 5.3 ratio — ABNORMAL HIGH (ref 0.0–5.0)
HDL: 34 mg/dL — ABNORMAL LOW (ref >39)
LDL Calculated: 88 mg/dL (ref 0–99)
Triglycerides: 293 mg/dL — ABNORMAL HIGH (ref 0–149)
VLDL Cholesterol Cal: 59 mg/dL — ABNORMAL HIGH (ref 5–40)

## 2017-08-01 LAB — BAYER DCA HB A1C WAIVED: HB A1C: 7.4 % — AB (ref <7.0)

## 2017-08-01 MED ORDER — INSULIN GLARGINE 100 UNIT/ML ~~LOC~~ SOLN: 30.0000 [IU] | Freq: Two times a day (BID) | SUBCUTANEOUS | 4 refills | Status: DC

## 2017-08-01 MED ORDER — ALPRAZOLAM 1 MG PO TABS: ORAL_TABLET | ORAL | 2 refills | Status: DC

## 2017-08-01 MED ORDER — PRAVASTATIN SODIUM 40 MG PO TABS: 40.0000 mg | ORAL_TABLET | Freq: Every day | ORAL | 3 refills | Status: DC

## 2017-08-01 MED ORDER — INSULIN ASPART 100 UNIT/ML ~~LOC~~ SOLN: SUBCUTANEOUS | 99 refills | Status: DC

## 2017-08-01 NOTE — Progress Notes (Signed)
  Subjective:   Patient ID: Gary Stone, male    DOB: 09-28-1975, 42 y.o.   MRN: [id number] CC: annual visit HPI: Gary Stone is a 42 y.o. male presenting for Follow-up (3 month)  Anxiety: had refill last week, says he doesn't remember discussion re needs to be seen for refills  DM1: BGLs have been low 100s, he does sometimes give himself two injections iwthin the hour when BGL is high, and then he says sometimes will have a low  HLD: has not been taking pravastatin, says he doesn't remember it getting prescribed  No family history of colon cancer or prostate cancer  Relevant past medical, surgical, family and social history reviewed. Allergies and medications reviewed and updated. Social History   Tobacco Use  Smoking Status Current Every Day Smoker  . Packs/day: 0.25  . Types: Cigarettes  Smokeless Tobacco Never Used   ROS: All systems negative other than what is in the HPI  Objective:    BP 129/73   Pulse 84   Temp (!) 97.5 F (36.4 C) (Oral)   Ht '5\' 11"'$  (1.803 m)   Wt 262 lb 9.6 oz (119.1 kg)   BMI 36.63 kg/m   Wt Readings from Last 3 Encounters:  08/01/17 262 lb 9.6 oz (119.1 kg)  07/30/17 261 lb (118.4 kg)  06/02/17 261 lb 9.6 oz (118.7 kg)    Gen: NAD, alert, cooperative with exam, NCAT EYES: EOMI, no conjunctival injection, or no icterus ENT: OP without erythema LYMPH: no cervical LAD CV: NRRR, normal S1/S2, no murmur, distal pulses 2+ b/l Resp: CTABL, no wheezes, normal WOB Abd: +BS, soft, NTND. no guarding or organomegaly Ext: No edema, warm Neuro: Alert and oriented, strength equal b/l UE and LE, coordination grossly normal MSK: normal muscle bulk  Assessment & Plan:  [name] was seen today for annual exam  Diagnoses and all orders for this visit:  Encounter for preventive health examination  Type 1 diabetes mellitus without complication (Gary Stone) O7H slightly elevated at 7.4, recently on prednisone Continue frequent blood sugar checks, insulin  as prescribed -     Bayer DCA Hb A1c Waived -     CMP14+EGFR -     insulin aspart (NOVOLOG) 100 UNIT/ML injection; Take 5-20u before meals per sliding scale -     insulin glargine (LANTUS) 100 UNIT/ML injection; Inject 0.3 mLs (30 Units total) into the skin 2 (two) times daily.  Generalized anxiety disorder Stable on a low regimen, has tried alternate medicines in the past with no relief We will continue below, cautioned re concomitant use of narcotic medications -     ALPRAZolam (XANAX) 1 MG tablet; 2-3 times a day as needed for anxiety  Hyperlipidemia, unspecified hyperlipidemia type Did not start pravastatin at last visit, open to starting today -     Lipid panel -     pravastatin (PRAVACHOL) 40 MG tablet; Take 1 tablet (40 mg total) by mouth daily.   Follow up plan: Return in about 3 months (around 10/30/2017). Gary Found, MD [place]

## 2017-08-13 ENCOUNTER — Encounter: Payer: Self-pay | Admitting: Family Medicine

## 2017-08-13 ENCOUNTER — Ambulatory Visit (INDEPENDENT_AMBULATORY_CARE_PROVIDER_SITE_OTHER): Payer: Worker's Compensation | Admitting: Family Medicine

## 2017-08-13 VITALS — BP 125/74 | HR 89 | Temp 97.4°F | Ht 71.0 in | Wt 260.0 lb

## 2017-08-13 DIAGNOSIS — M654 Radial styloid tenosynovitis [de Quervain]: Secondary | ICD-10-CM | POA: Diagnosis not present

## 2017-08-13 DIAGNOSIS — M25532 Pain in left wrist: Secondary | ICD-10-CM

## 2017-08-13 MED ORDER — DICLOFENAC SODIUM 75 MG PO TBEC: 75.0000 mg | DELAYED_RELEASE_TABLET | Freq: Two times a day (BID) | ORAL | 0 refills | Status: DC

## 2017-08-13 NOTE — Progress Notes (Signed)
Subjective:  Patient ID: Gary Stone, male    DOB: 04-Dec-1975  Age: 42 y.o. MRN: [id number]  CC: Workers Comp f/u (pt here today following up for left wrist pain and he says while the wrist is in the brace it is ok but when he takes the brace off he can't really use his wrist without quite a bit of pain.)   HPI Gary Stone presents for recheck of the left wrist.  He keeps it in the wrist brace most of the time but he is still having pain.  He points to the area of the radial styloid.  Depression screen [facility] 2/9 06/02/2017 04/29/2017 03/25/2017  Decreased Interest 0 0 0  Down, Depressed, Hopeless 0 0 0  PHQ - 2 Score 0 0 0    History Gary Stone has a past medical history of Anxiety and Diabetes mellitus without complication ([place]).   He has a past surgical history that includes Ankle surgery (Right).   His family history is not on file.He reports that he has been smoking cigarettes.  He has been smoking about 0.25 packs per day. he has never used smokeless tobacco. He reports that he does not drink alcohol or use drugs.    ROS Review of Systems  Constitutional: Negative for chills, diaphoresis and fever.  Musculoskeletal: Positive for arthralgias (See HPI) and joint swelling.    Objective:  BP 125/74   Pulse 89   Temp (!) 97.4 F (36.3 C) (Oral)   Ht 5' 11"  (1.803 m)   Wt 260 lb (117.9 kg)   BMI 36.26 kg/m   BP Readings from Last 3 Encounters:  08/13/17 125/74  08/01/17 129/73  07/30/17 129/78    Wt Readings from Last 3 Encounters:  08/13/17 260 lb (117.9 kg)  08/01/17 262 lb 9.6 oz (119.1 kg)  07/30/17 261 lb (118.4 kg)     Physical Exam  Musculoskeletal: He exhibits edema and tenderness (Noted at the radial styloid with painful side side motion of the wrist).      Assessment & Plan:   Gary Stone was seen today for workers comp f/u.  Diagnoses and all orders for this visit:  Left wrist pain -     MR WRIST LEFT W WO CONTRAST; Future -     Ambulatory  referral to Orthopedic Surgery  De Quervain's tenosynovitis, left  Other orders -     diclofenac (VOLTAREN) 75 MG EC tablet; Take 1 tablet (75 mg total) by mouth 2 (two) times daily.       I am having Gary Stone start on diclofenac. I am also having him maintain his HYDROcodone-acetaminophen, blood glucose meter kit and supplies, morphine, gabapentin, CHANTIX CONTINUING MONTH PAK, B-D INS SYRINGE 0.5CC/30GX1/2", Insulin Syringe-Needle U-100, BAYER CONTOUR NEXT USB MONITOR, glucose blood, ALPRAZolam, insulin aspart, insulin glargine, and pravastatin.  Allergies as of 08/13/2017   No Known Allergies     Medication List        Accurate as of 08/13/17 11:59 PM. Always use your most recent med list.          ALPRAZolam 1 MG tablet Commonly known as:  XANAX 2-3 times a day as needed for anxiety   B-D INS SYRINGE 0.5CC/30GX1/2" 30G X 1/2" 0.5 ML Misc Generic drug:  Insulin Syringe-Needle U-100 1 EACH BY DOES NOT APPLY ROUTE 2 (TWO) TIMES DAILY.   Insulin Syringe-Needle U-100 30G X 1/2" 0.5 ML Misc Commonly known as:  B-D INS SYRINGE 0.5CC/30GX1/2" 1 each by Does not apply route  2 (two) times daily.   BAYER CONTOUR NEXT USB MONITOR w/Device Kit Use to test bs 5 times a day and as needed   blood glucose meter kit and supplies Kit Dispense based on patient and insurance preference. Use up to four times daily as directed. (FOR ICD-9 250.00, 250.01).   CHANTIX CONTINUING MONTH PAK 1 MG tablet Generic drug:  varenicline Take 1 mg by mouth daily.   diclofenac 75 MG EC tablet Commonly known as:  VOLTAREN Take 1 tablet (75 mg total) by mouth 2 (two) times daily.   gabapentin 600 MG tablet Commonly known as:  NEURONTIN Take 600 mg by mouth 3 (three) times daily.   glucose blood test strip Commonly known as:  ONE TOUCH ULTRA TEST Use as instructed to check six times a day   HYDROcodone-acetaminophen 10-325 MG tablet Commonly known as:  NORCO Take 1 tablet by mouth every  8 (eight) hours as needed.   insulin aspart 100 UNIT/ML injection Commonly known as:  novoLOG Take 5-20u before meals per sliding scale   insulin glargine 100 UNIT/ML injection Commonly known as:  LANTUS Inject 0.3 mLs (30 Units total) into the skin 2 (two) times daily.   morphine 15 MG 12 hr tablet Commonly known as:  MS CONTIN Take 15 mg by mouth every 12 (twelve) hours.   pravastatin 40 MG tablet Commonly known as:  PRAVACHOL Take 1 tablet (40 mg total) by mouth daily.        Follow-up: Return if symptoms worsen or fail to improve.  Claretta Fraise, M.D.

## 2017-08-13 NOTE — Progress Notes (Signed)
Error WS

## 2017-08-13 NOTE — Patient Instructions (Signed)
Continue wearing splint. °

## 2017-08-15 ENCOUNTER — Encounter: Payer: Self-pay | Admitting: Family Medicine

## 2017-08-26 ENCOUNTER — Encounter: Payer: Self-pay | Admitting: *Deleted

## 2017-08-26 ENCOUNTER — Other Ambulatory Visit: Payer: Self-pay | Admitting: *Deleted

## 2017-08-26 DIAGNOSIS — Z135 Encounter for screening for eye and ear disorders: Secondary | ICD-10-CM

## 2017-08-26 NOTE — Progress Notes (Signed)
Received call from [facility].  Patient needs order for orbital x-ray to check for metal in his eyes before having the MRI that was ordered for his wrist as he has worked with metal in the past. Order entered per Dr. Darlyn ReadStacks [facility].

## 2017-08-28 ENCOUNTER — Other Ambulatory Visit: Payer: Self-pay | Admitting: Pediatrics

## 2017-09-24 ENCOUNTER — Other Ambulatory Visit: Payer: Self-pay | Admitting: Pediatrics

## 2017-09-24 DIAGNOSIS — E109 Type 1 diabetes mellitus without complications: Secondary | ICD-10-CM

## 2017-10-30 ENCOUNTER — Ambulatory Visit: Payer: Self-pay | Admitting: Pediatrics

## 2017-11-03 ENCOUNTER — Encounter: Payer: Self-pay | Admitting: Pediatrics

## 2017-11-03 ENCOUNTER — Ambulatory Visit (INDEPENDENT_AMBULATORY_CARE_PROVIDER_SITE_OTHER): Payer: BLUE CROSS/BLUE SHIELD | Admitting: Pediatrics

## 2017-11-03 VITALS — BP 131/78 | HR 86 | Temp 98.7°F | Ht 71.0 in | Wt 262.8 lb

## 2017-11-03 DIAGNOSIS — E109 Type 1 diabetes mellitus without complications: Secondary | ICD-10-CM

## 2017-11-03 DIAGNOSIS — Z135 Encounter for screening for eye and ear disorders: Secondary | ICD-10-CM

## 2017-11-03 DIAGNOSIS — F411 Generalized anxiety disorder: Secondary | ICD-10-CM

## 2017-11-03 DIAGNOSIS — Z79899 Other long term (current) drug therapy: Secondary | ICD-10-CM | POA: Diagnosis not present

## 2017-11-03 DIAGNOSIS — Z23 Encounter for immunization: Secondary | ICD-10-CM | POA: Diagnosis not present

## 2017-11-03 LAB — BAYER DCA HB A1C WAIVED: HB A1C (BAYER DCA - WAIVED): 6.5 % (ref <7.0)

## 2017-11-03 MED ORDER — INSULIN ASPART 100 UNIT/ML ~~LOC~~ SOLN: SUBCUTANEOUS | 99 refills | Status: DC

## 2017-11-03 MED ORDER — ALPRAZOLAM 1 MG PO TABS: ORAL_TABLET | ORAL | 2 refills | Status: DC

## 2017-11-03 MED ORDER — INSULIN SYRINGE-NEEDLE U-100 30G X 1/2" 0.5 ML MISC
1.0000 | Freq: Two times a day (BID) | 2 refills | Status: DC
Start: 2017-11-03 — End: 2023-12-24

## 2017-11-03 MED ORDER — INSULIN GLARGINE 100 UNIT/ML ~~LOC~~ SOLN: 30.0000 [IU] | Freq: Two times a day (BID) | SUBCUTANEOUS | 4 refills | Status: DC

## 2017-11-03 NOTE — Patient Instructions (Signed)
Call to set up appt to get eye exam

## 2017-11-03 NOTE — Progress Notes (Signed)
  Subjective:   Patient ID: Gary Stone, male    DOB: [birth year], 42 y.o.   MRN: [id number] CC: Follow-up (3 month) Multiple medical problems HPI: Gary Stone is a 42 y.o. male presenting for Follow-up (3 month)  DM1: no lows, no numbers over 200.  Due for eye exam.  Anxiety: taking xanax 2-3 times a day, tried multiple other medicines, including longer acting Klonopin with no relief from symptoms.  Hyperlipidemia associated with diabetes: Not yet started pravastatin.  Open to it.  Relevant past medical, surgical, family and social history reviewed. Allergies and medications reviewed and updated. Social History   Tobacco Use  Smoking Status Current Every Day Smoker  . Packs/day: 0.25  . Types: Cigarettes  Smokeless Tobacco Never Used   ROS: Per HPI   Objective:    BP 131/78   Pulse 86   Temp 98.7 F (37.1 C) (Oral)   Ht 5\' 11"  (1.803 m)   Wt 262 lb 12.8 oz (119.2 kg)   BMI 36.65 kg/m   Wt Readings from Last 3 Encounters:  11/03/17 262 lb 12.8 oz (119.2 kg)  08/13/17 260 lb (117.9 kg)  08/01/17 262 lb 9.6 oz (119.1 kg)    Gen: NAD, alert, cooperative with exam, NCAT EYES: EOMI, no conjunctival injection, or no icterus ENT:  OP without erythema LYMPH: no cervical LAD CV: NRRR, normal S1/S2, no murmur, distal pulses 2+ b/l Resp: CTABL, no wheezes, normal WOB Ext: No edema, warm Neuro: Alert and oriented, strength equal b/l UE and LE, coordination grossly normal MSK: normal muscle bulk  Assessment & Plan:  [name] was seen today for follow-up multiple med problems.  Diagnoses and all orders for this visit:  Type 1 diabetes mellitus without complication (HCC) A1c 6.5, continue current medicines. -     Bayer DCA Hb A1c Waived -     Microalbumin / creatinine urine ratio -     insulin aspart (NOVOLOG) 100 UNIT/ML injection; Take 5-20u before meals per sliding scale -     insulin glargine (LANTUS) 100 UNIT/ML injection; Inject 0.3 mLs (30 Units total) into the  skin 2 (two) times daily. -     Insulin Syringe-Needle U-100 (B-D INS SYRINGE 0.5CC/30GX1/2") 30G X 1/2" 0.5 ML MISC; 1 each by Does not apply route 2 (two) times daily.  Generalized anxiety disorder Continue treatment with Xanax as below.  Has been on this dose for years, has withdrawal symptoms when he misses.  Has tried longer acting medicine without the same effect.  Also on chronic narcotic medicines, following at a pain clinic.  Aware of risk of respiratory suppression being on both narcotics and benzos.  We will continue to assess benefit of medicine.  Chronic prescription benzodiazepine use -     ALPRAZolam (XANAX) 1 MG tablet; 2-3 times a day as needed for anxiety  Screening for eye condition Due for diabetic eye exam.  Patient aware.  Follow up plan: Return in about 3 months (around 02/02/2018). Rex Krasarol Vincent, MD [facility]

## 2017-11-27 ENCOUNTER — Ambulatory Visit (INDEPENDENT_AMBULATORY_CARE_PROVIDER_SITE_OTHER): Payer: BLUE CROSS/BLUE SHIELD | Admitting: Pediatrics

## 2017-11-27 ENCOUNTER — Encounter: Payer: Self-pay | Admitting: Pediatrics

## 2017-11-27 VITALS — BP 122/76 | HR 89 | Temp 97.8°F | Ht 71.0 in | Wt 258.4 lb

## 2017-11-27 DIAGNOSIS — E109 Type 1 diabetes mellitus without complications: Secondary | ICD-10-CM

## 2017-11-27 DIAGNOSIS — E162 Hypoglycemia, unspecified: Secondary | ICD-10-CM

## 2017-11-27 LAB — GLUCOSE HEMOCUE WAIVED: Glu Hemocue Waived: 56 mg/dL — ABNORMAL LOW (ref 65–99)

## 2017-11-27 MED ORDER — GLUCOSE 15 GM/33GM PO GEL: 1.0000 "application " | Freq: Every day | ORAL | 5 refills | Status: DC | PRN

## 2017-11-27 MED ORDER — INSULIN GLARGINE 100 UNIT/ML ~~LOC~~ SOLN: 25.0000 [IU] | Freq: Two times a day (BID) | SUBCUTANEOUS | 4 refills | Status: DC

## 2017-11-27 NOTE — Progress Notes (Signed)
Subjective:   Patient ID: Gary Stone, male    DOB: Nov 27, 1975, 42 y.o.   MRN: [id number] CC: Fluctuation in BS   HPI: Gary Stone is a 42 y.o. male   Here today with his wife.  Has had type 1 diabetes since he was young.  Has been on the same regimen of insulin since moving to this area, taking 30 units of glargine twice a day and short acting insulin with meals on a sliding scale.  Recently seen here and denied lows at that time.  Was followed by endocrinology when living in [place] state.  For the last 6 months he has had episodes of blood sugar dropping into the 30s at night.  He will have to eat a lot of food in order to raise the blood sugar, by the next morning its in the high 300s.  Usually blood sugars range from 70-145.  He is recently switched to third shift, is working now overnight.  Has a very physical job, lifting and moving things throughout his shift.  He eats meals regularly, has not been skipping meals.  He does not have to give himself short acting insulin at work because his blood sugars are always at goal even with eating.  No fevers, feeling fine right now.  Most recent drop was last night.  He was at work and had about an hour where he was confused and not himself.  Relevant past medical, surgical, family and social history reviewed. Allergies and medications reviewed and updated. Social History   Tobacco Use  Smoking Status Current Every Day Smoker  . Packs/day: 0.25  . Types: Cigarettes  Smokeless Tobacco Never Used   ROS: Per HPI   Objective:    BP 122/76   Pulse 89   Temp 97.8 F (36.6 C) (Oral)   Ht 5' 11"  (1.803 m)   Wt 258 lb 6.4 oz (117.2 kg)   BMI 36.04 kg/m   Wt Readings from Last 3 Encounters:  11/27/17 258 lb 6.4 oz (117.2 kg)  11/03/17 262 lb 12.8 oz (119.2 kg)  08/13/17 260 lb (117.9 kg)    Gen: NAD, alert, cooperative with exam, NCAT EYES: EOMI, no conjunctival injection, or no icterus ENT: OP without erythema LYMPH: no  cervical LAD CV: NRRR, normal S1/S2, no murmur, distal pulses 2+ b/l Resp: CTABL, no wheezes, normal WOB Abd: +BS, soft, NTND. no guarding or organomegaly Ext: No edema, warm Neuro: Alert and oriented, strength equal b/l UE and LE, coordination grossly normal MSK: normal muscle bulk  Assessment & Plan:  Gary Stone was seen today for fluctuation in bs .  Diagnoses and all orders for this visit:  Type 1 diabetes mellitus without complication ([place]) Feeling well now.  Decrease glargine to 25 units twice daily.  Any lows will need to decrease further.  Recently switched from daytime shift at nighttime.  No signs of infection.  Discussed glargine should be the basal insulin, if not needing short acting insulin with meals with type 1 diabetes we are likely overdosing his long-acting insulin.  Currently using insulin in vials, drawing up with syringes.  Has a glucose monitor.  Discussed other options including insulin pumps and continuous glucose monitoring.  Patient says he is nervous because he does not trust other methods as much as he does what he has been using for years. -     Ambulatory referral to Endocrinology -     Dextrose, Diabetic Use, (GLUCOSE) 15 GM/33GM GEL; Take 1 application by mouth  daily as needed. -     BMP8+EGFR -     insulin glargine (LANTUS) 100 UNIT/ML injection; Inject 0.25 mLs (25 Units total) into the skin 2 (two) times daily.  Hypoglycemia   Follow up plan: 2 months, sooner if needed. [name], MD [place]

## 2017-11-28 ENCOUNTER — Telehealth: Payer: Self-pay | Admitting: Family Medicine

## 2017-11-28 DIAGNOSIS — F411 Generalized anxiety disorder: Secondary | ICD-10-CM

## 2017-11-28 LAB — BMP8+EGFR
BUN/Creatinine Ratio: 17 (ref 9–20)
BUN: 21 mg/dL (ref 6–24)
CALCIUM: 10.3 mg/dL — AB (ref 8.7–10.2)
CO2: 27 mmol/L (ref 20–29)
CREATININE: 1.22 mg/dL (ref 0.76–1.27)
Chloride: 100 mmol/L (ref 96–106)
GFR, EST AFRICAN AMERICAN: 85 mL/min/{1.73_m2} (ref >59)
GFR, EST NON AFRICAN AMERICAN: 73 mL/min/{1.73_m2} (ref >59)
Glucose: 72 mg/dL (ref 65–99)
Potassium: 4.4 mmol/L (ref 3.5–5.2)
Sodium: 142 mmol/L (ref 134–144)

## 2017-11-28 NOTE — Telephone Encounter (Signed)
I was contacted by the patient's pain doctor at [facility] pain clinic.  He is concerned about the use of Xanax.  The patient is currently on 60 morphine milligram equivalents daily.  He asked that we consider tapering him off of this medicine and or referring to psychiatry for consultation.  As a result of the conversation and his angst concerning the use of the 2 medications together in this patient I agreed to psychiatry referral.  This note is being forwarded to Dr. Oswaldo Done for her consideration regarding the use of the 2 medications together specifically with regard to tapering the alprazolam.  Mechele Claude MD.

## 2017-11-29 NOTE — Telephone Encounter (Signed)
Thank you, agree with psychiatry referral to look into alternate treatment for anxiety as requested by pain clinic.   Pt and I have had extensive discussions in the past about the combined use of pain medicine and benzodiazepines.  He has been on benzodiazepines for years and tried on multiple other medicines in the past with poor control of anxiety but reasonable to get another opinion given known concern of being on the combined use of medicines as requested by his pain management physician.    [facility]-can you let patient know about the referral so that he is aware?

## 2017-12-04 ENCOUNTER — Telehealth: Payer: Self-pay | Admitting: *Deleted

## 2017-12-04 NOTE — Telephone Encounter (Signed)
Pt is having a problem with fluctuating BS Was unable to work last night Pt also requesting note for tonight Pt saw Dr Oswaldo Done on 5/2 Pt could not get in with endocrinology until July Please advise

## 2017-12-10 ENCOUNTER — Ambulatory Visit (INDEPENDENT_AMBULATORY_CARE_PROVIDER_SITE_OTHER): Payer: BLUE CROSS/BLUE SHIELD | Admitting: Pediatrics

## 2017-12-10 ENCOUNTER — Encounter: Payer: Self-pay | Admitting: Pediatrics

## 2017-12-10 VITALS — BP 127/81 | HR 80 | Temp 97.0°F | Ht 71.0 in | Wt 259.8 lb

## 2017-12-10 DIAGNOSIS — E162 Hypoglycemia, unspecified: Secondary | ICD-10-CM | POA: Diagnosis not present

## 2017-12-10 DIAGNOSIS — E109 Type 1 diabetes mellitus without complications: Secondary | ICD-10-CM | POA: Diagnosis not present

## 2017-12-10 NOTE — Telephone Encounter (Signed)
Patient is seeing Dr. Oswaldo Done today- this encounter will be closed.

## 2017-12-10 NOTE — Progress Notes (Signed)
  Subjective:   Patient ID: Gary Stone, male    DOB: Oct 11, 1975, 42 y.o.   MRN: [id number] CC: Blood Sugar Problem (Fluctuating)  HPI: Gary Stone is a 42 y.o. male   Seen 5/2 for fluctuating BGLs. Insulin decreased, referred to endocrine.  Had another episode last week of confusion at work, acting more irritated. Doesn't know what his BGL was, started snacking on chips, found himself at the [employer] [place]. Checked blood sugar after eating some, was 65.   Taking glargine now 25u BID, decreased from 30uBID last week. Giving apprx 10u of short acting insulin in a day.  Still working nights but switching to days. Fluctuating BGLs started when he started night shifts.  Last night at start of work was 221, was 74 at lunch, 140 early morning, 165 early this morning. No short acting insulin used over night. Has a very physical job.  Relevant past medical, surgical, family and social history reviewed. Allergies and medications reviewed and updated. Social History   Tobacco Use  Smoking Status Current Every Day Smoker  . Packs/day: 0.25  . Types: Cigarettes  Smokeless Tobacco Never Used   ROS: Per HPI   Objective:    BP 127/81   Pulse 80   Temp (!) 97 F (36.1 C) (Oral)   Ht  (1.803 m)   Wt 259 lb 12.8 oz (117.8 kg)   BMI 36.23 kg/m   Wt Readings from Last 3 Encounters:  12/10/17 259 lb 12.8 oz (117.8 kg)  11/27/17 258 lb 6.4 oz (117.2 kg)  11/03/17 262 lb 12.8 oz (119.2 kg)    Gen: NAD, alert, cooperative with exam, NCAT EYES: EOMI, no conjunctival injection, or no icterus CV: NRRR, normal S1/S2, no murmur, distal pulses 2+ b/l Resp: CTABL, no wheezes, normal WOB Abd: +BS, soft, NTND. no guarding or organomegaly Ext: No edema, warm Neuro: Alert and oriented MSK: normal muscle bulk  Assessment & Plan:  Gary Stone was seen today for blood sugar problem.  Diagnoses and all orders for this visit:  Hypoglycemia  Type 1 diabetes mellitus without complication  (HCC) Decrease glargine to 20 units twice a day.  Continue to check blood sugar levels before meals, write down and bring to next clinic visit. Has appt with endocrine in July. Lows < 75, and highs >300 let me know.   Follow up plan: Return in about 1 month (around 01/07/2018). Rex Kras, MD [facility]

## 2018-01-07 ENCOUNTER — Telehealth: Payer: Self-pay | Admitting: Pediatrics

## 2018-01-07 NOTE — Telephone Encounter (Signed)
Last three labs are ready to pickup

## 2018-01-15 ENCOUNTER — Telehealth: Payer: Self-pay | Admitting: Pediatrics

## 2018-01-16 NOTE — Telephone Encounter (Signed)
They will need to discuss with pain management

## 2018-01-16 NOTE — Telephone Encounter (Signed)
Wife aware

## 2018-01-21 ENCOUNTER — Telehealth (HOSPITAL_COMMUNITY): Payer: Self-pay | Admitting: Pediatrics

## 2018-02-02 ENCOUNTER — Ambulatory Visit: Payer: Self-pay | Admitting: Pediatrics

## 2018-02-03 ENCOUNTER — Encounter: Payer: Self-pay | Admitting: Pediatrics

## 2018-02-12 ENCOUNTER — Ambulatory Visit: Payer: Self-pay | Admitting: Endocrinology

## 2018-03-12 ENCOUNTER — Other Ambulatory Visit: Payer: Self-pay | Admitting: Pediatrics

## 2018-03-12 DIAGNOSIS — Z79899 Other long term (current) drug therapy: Secondary | ICD-10-CM

## 2018-03-12 NOTE — Telephone Encounter (Signed)
Needs to be seen, pt aware from discussions at [facility].

## 2018-03-12 NOTE — Telephone Encounter (Signed)
Last seen 12/10/17  Dr Oswaldo DoneVincent

## 2018-03-25 ENCOUNTER — Telehealth: Payer: Self-pay | Admitting: Pediatrics

## 2018-03-25 DIAGNOSIS — Z79899 Other long term (current) drug therapy: Secondary | ICD-10-CM

## 2018-03-25 MED ORDER — ALPRAZOLAM 1 MG PO TABS: ORAL_TABLET | ORAL | 0 refills | Status: DC

## 2018-03-25 NOTE — Telephone Encounter (Signed)
Patient aware.

## 2018-03-25 NOTE — Telephone Encounter (Signed)
Sent in 30 days. Must be seen for more refills.

## 2018-04-22 ENCOUNTER — Encounter: Payer: Self-pay | Admitting: Pediatrics

## 2018-04-22 ENCOUNTER — Ambulatory Visit (INDEPENDENT_AMBULATORY_CARE_PROVIDER_SITE_OTHER): Payer: BLUE CROSS/BLUE SHIELD | Admitting: Pediatrics

## 2018-04-22 VITALS — BP 128/71 | HR 103 | Temp 97.9°F | Ht 71.0 in | Wt 246.0 lb

## 2018-04-22 DIAGNOSIS — Z79899 Other long term (current) drug therapy: Secondary | ICD-10-CM | POA: Diagnosis not present

## 2018-04-22 DIAGNOSIS — E109 Type 1 diabetes mellitus without complications: Secondary | ICD-10-CM

## 2018-04-22 DIAGNOSIS — R062 Wheezing: Secondary | ICD-10-CM

## 2018-04-22 MED ORDER — BENZONATATE 200 MG PO CAPS: 200.0000 mg | ORAL_CAPSULE | Freq: Two times a day (BID) | ORAL | 0 refills | Status: DC | PRN

## 2018-04-22 MED ORDER — ALPRAZOLAM 1 MG PO TABS: ORAL_TABLET | ORAL | 2 refills | Status: DC

## 2018-04-22 MED ORDER — AZITHROMYCIN 250 MG PO TABS: ORAL_TABLET | ORAL | 0 refills | Status: DC

## 2018-04-22 MED ORDER — PREDNISONE 20 MG PO TABS: ORAL_TABLET | ORAL | 0 refills | Status: DC

## 2018-04-22 NOTE — Progress Notes (Signed)
  Subjective:   Patient ID: Gary Stone, male    DOB: 03-Nov-1975, 42 y.o.   MRN: [id number] CC: Medication Refill; Diabetes; and Cough (phlegm, bad cough hurts throat gets once a year, gets abx & tessalon pearles)  HPI: Gary Stone is a 42 y.o. male   Cough: started several days.  Has been productive.  Thinks he may be wheezing some.  Slightly more short of breath than usual.  Working on smoking cessation, planning to restart Chantix, he says he has enough at home right now.  Albuterol makes the cough worse.  Diabetes: Blood sugars have been 80s-low 100s for the most part.  Rarely having low numbers since back to working day shift.  Anxiety: Taking Xanax as prescribed, 2-3 times a day.   Relevant past medical, surgical, family and social history reviewed. Allergies and medications reviewed and updated. Social History   Tobacco Use  Smoking Status Current Every Day Smoker  . Packs/day: 0.25  . Types: Cigarettes  Smokeless Tobacco Never Used   ROS: Per HPI   Objective:    BP 128/71   Pulse (!) 103   Temp 97.9 F (36.6 C) (Oral)   Ht 5\' 11"  (1.803 m)   Wt 246 lb (111.6 kg)   BMI 34.31 kg/m   Wt Readings from Last 3 Encounters:  04/22/18 246 lb (111.6 kg)  12/10/17 259 lb 12.8 oz (117.8 kg)  11/27/17 258 lb 6.4 oz (117.2 kg)    Gen: NAD, alert, cooperative with exam, NCAT EYES: EOMI, no conjunctival injection, or no icterus ENT:  TMs dull gray b/l, OP without erythema LYMPH: no cervical LAD CV: NRRR, normal S1/S2, no murmur, distal pulses 2+ b/l Resp: end exp wheeze b/l, no rales, normal WOB Abd: +BS, soft, NTND.  Ext: No edema, warm Neuro: Alert and oriented, strength equal b/l UE and LE, coordination grossly normal MSK: normal muscle bulk  Assessment & Plan:  Gary Stone was seen today for medication refill, diabetes and cough.  Diagnoses and all orders for this visit:  Wheezing Start below.  Use albuterol 2 puffs 3 times a day as able.  If not improving start  the prednisone.  Will cause elevated blood sugars. -     azithromycin (ZITHROMAX) 250 MG tablet; Take 2 the first day and then one each day after. -     predniSONE (DELTASONE) 20 MG tablet; 2 po at same time daily for 3 days -     benzonatate (TESSALON) 200 MG capsule; Take 1 capsule (200 mg total) by mouth 2 (two) times daily as needed for cough.  Chronic prescription benzodiazepine use Has been tried on multiple pole of the alternate medications in the past, symptoms have been stable below.  Following with pain management for chronic pain -     ALPRAZolam (XANAX) 1 MG tablet; 1 tab 2-3 times a day as needed for anxiety  Type 1 diabetes mellitus without complication (HCC) We will check average blood sugar next visit.  Any lows let me know.  Follow up plan: Return in about 3 months (around 07/22/2018). Rex Kras, MD [facility]

## 2018-07-03 ENCOUNTER — Other Ambulatory Visit: Payer: Self-pay | Admitting: Pediatrics

## 2018-07-03 ENCOUNTER — Telehealth: Payer: Self-pay | Admitting: Pediatrics

## 2018-07-03 DIAGNOSIS — E109 Type 1 diabetes mellitus without complications: Secondary | ICD-10-CM

## 2018-07-03 MED ORDER — BASAGLAR KWIKPEN 100 UNIT/ML ~~LOC~~ SOPN: 25.0000 [IU] | PEN_INJECTOR | Freq: Every day | SUBCUTANEOUS | 1 refills | Status: DC

## 2018-07-03 NOTE — Telephone Encounter (Signed)
Pharm requesting chenge to HUMALOG for insurance approval

## 2018-07-03 NOTE — Telephone Encounter (Signed)
Pt aware of new medication being sent in.

## 2018-07-04 MED ORDER — INSULIN LISPRO 100 UNIT/ML ~~LOC~~ SOLN: SUBCUTANEOUS | 4 refills | Status: DC

## 2018-07-14 ENCOUNTER — Other Ambulatory Visit: Payer: Self-pay | Admitting: *Deleted

## 2018-07-14 MED ORDER — GLUCOSE BLOOD VI STRP: ORAL_STRIP | 3 refills | Status: DC

## 2018-07-15 ENCOUNTER — Telehealth: Payer: Self-pay | Admitting: *Deleted

## 2018-07-15 MED ORDER — GLUCOSE BLOOD VI STRP: ORAL_STRIP | 3 refills | Status: DC

## 2018-07-15 MED ORDER — INSULIN PEN NEEDLE 32G X 4 MM MISC: 3 refills | Status: DC

## 2018-07-15 NOTE — Telephone Encounter (Signed)
Pen needles sent to pharmacy Test strips changed to One Touch Ultra

## 2018-07-27 ENCOUNTER — Ambulatory Visit (INDEPENDENT_AMBULATORY_CARE_PROVIDER_SITE_OTHER): Payer: Managed Care, Other (non HMO) | Admitting: Pediatrics

## 2018-07-27 ENCOUNTER — Encounter: Payer: Self-pay | Admitting: Pediatrics

## 2018-07-27 VITALS — BP 133/76 | HR 87 | Temp 97.7°F | Ht 71.0 in | Wt 259.2 lb

## 2018-07-27 DIAGNOSIS — E785 Hyperlipidemia, unspecified: Secondary | ICD-10-CM

## 2018-07-27 DIAGNOSIS — E109 Type 1 diabetes mellitus without complications: Secondary | ICD-10-CM

## 2018-07-27 DIAGNOSIS — Z79899 Other long term (current) drug therapy: Secondary | ICD-10-CM

## 2018-07-27 LAB — BAYER DCA HB A1C WAIVED: HB A1C (BAYER DCA - WAIVED): 7.6 % — ABNORMAL HIGH (ref <7.0)

## 2018-07-27 MED ORDER — ALPRAZOLAM 1 MG PO TABS: ORAL_TABLET | ORAL | 5 refills | Status: DC

## 2018-07-27 MED ORDER — BASAGLAR KWIKPEN 100 UNIT/ML ~~LOC~~ SOPN: 25.0000 [IU] | PEN_INJECTOR | Freq: Two times a day (BID) | SUBCUTANEOUS | 2 refills | Status: DC

## 2018-07-27 MED ORDER — PRAVASTATIN SODIUM 40 MG PO TABS: 40.0000 mg | ORAL_TABLET | Freq: Every day | ORAL | 3 refills | Status: DC

## 2018-07-27 NOTE — Progress Notes (Signed)
  Subjective:   Patient ID: Gary Stone, male    DOB: 1975-11-11, 42 y.o.   MRN: [id number] CC: Medical Management of Chronic Issues and Back Pain (Lower)  HPI: Gary Stone is a 42 y.o. male   Recent switch in insurance. For DM1 on glargine 25u BID, humalog SSI 5-20u before meals. No recent lows. BGLs have been slightly more elevated recently with the holidays.  Anxiety: well controlled on current regimen. Taking xanax 2-3 times a day, almost always twice daily, sometimes 3 times.  Hyperlipidemia: tolerating statin, no side effects  Back pain: started a couple of weeks ago, worse with certain movements, improving. Trying gentle stretches/exercises at home.  Relevant past medical, surgical, family and social history reviewed. Allergies and medications reviewed and updated. Social History   Tobacco Use  Smoking Status Current Every Day Smoker  . Packs/day: 0.25  . Types: Cigarettes  Smokeless Tobacco Never Used   ROS: Per HPI   Objective:    BP 133/76   Pulse 87   Temp 97.7 F (36.5 C) (Oral)   Ht 5\' 11"  (1.803 m)   Wt 259 lb 3.2 oz (117.6 kg)   BMI 36.15 kg/m   Wt Readings from Last 3 Encounters:  07/27/18 259 lb 3.2 oz (117.6 kg)  04/22/18 246 lb (111.6 kg)  12/10/17 259 lb 12.8 oz (117.8 kg)    Gen: NAD, alert, cooperative with exam, NCAT EYES: EOMI, no conjunctival injection, or no icterus CV: NRRR, normal S1/S2, no murmur, distal pulses 2+ b/l Resp: CTABL, no wheezes, normal WOB Abd: +BS, soft, NTND. no guarding or organomegaly Ext: No edema, warm Neuro: Alert and oriented, strength equal b/l UE and LE, coordination grossly normal MSK: normal muscle bulk  Assessment & Plan:  Gary Stone was seen today for medical management of chronic issues and back pain.  Diagnoses and all orders for this visit:  Type 1 diabetes mellitus without complication (HCC) A1c up to 7.6, cont current insulin regimen, avoid sugary foods -     Bayer DCA Hb A1c Waived -     Insulin  Glargine (BASAGLAR KWIKPEN) 100 UNIT/ML SOPN; Inject 0.25 mLs (25 Units total) into the skin 2 (two) times daily. -     Microalbumin / creatinine urine ratio  Hyperlipidemia, unspecified hyperlipidemia type Stable, cont below -     pravastatin (PRAVACHOL) 40 MG tablet; Take 1 tablet (40 mg total) by mouth daily.  Chronic prescription benzodiazepine use Stable, cont below -     ALPRAZolam (XANAX) 1 MG tablet; 1 tab 2-3 times a day as needed for anxiety   Follow up plan: Return in about 3 months (around 10/26/2018) for  DM1 follow up. Rex Krasarol , MD [facility]

## 2018-08-12 DIAGNOSIS — Z029 Encounter for administrative examinations, unspecified: Secondary | ICD-10-CM

## 2018-08-13 ENCOUNTER — Telehealth: Payer: Self-pay | Admitting: Pediatrics

## 2018-08-13 NOTE — Telephone Encounter (Signed)
Do you know anything about these papers

## 2018-08-14 NOTE — Telephone Encounter (Signed)
I believe up front. Can confirm with Cathy/front desk. We did complete and sign them.

## 2018-08-14 NOTE — Telephone Encounter (Signed)
Pt picked up form yesterday per front office manager

## 2018-10-26 ENCOUNTER — Ambulatory Visit (INDEPENDENT_AMBULATORY_CARE_PROVIDER_SITE_OTHER): Payer: Managed Care, Other (non HMO) | Admitting: Family Medicine

## 2018-10-26 ENCOUNTER — Other Ambulatory Visit: Payer: Self-pay

## 2018-10-26 VITALS — BP 125/74 | HR 81 | Temp 97.0°F | Ht 71.0 in | Wt 267.0 lb

## 2018-10-26 DIAGNOSIS — E109 Type 1 diabetes mellitus without complications: Secondary | ICD-10-CM

## 2018-10-26 DIAGNOSIS — G8921 Chronic pain due to trauma: Secondary | ICD-10-CM | POA: Diagnosis not present

## 2018-10-26 DIAGNOSIS — F411 Generalized anxiety disorder: Secondary | ICD-10-CM | POA: Diagnosis not present

## 2018-10-26 DIAGNOSIS — E1069 Type 1 diabetes mellitus with other specified complication: Secondary | ICD-10-CM

## 2018-10-26 DIAGNOSIS — E785 Hyperlipidemia, unspecified: Secondary | ICD-10-CM

## 2018-10-26 LAB — BAYER DCA HB A1C WAIVED: HB A1C: 6.8 % (ref <7.0)

## 2018-10-26 NOTE — Progress Notes (Signed)
Subjective: CC: Type 1 DM PCP: Janora Norlander, DO FRT:MYTRZNB Gary Stone is a 43 y.o. male presenting to clinic today for:  1. Type 1 DM Diagnosed in his 20s.  He has been hospitalized for DKA a few times in his lifetime.  No hospitalizations for DM1 in >10 years.  Patient reports: Glucometer: One Touch ultra, High at home: Never greater than 200; Low at home: 80.  Fasting blood sugars tend to be between 80 and 120. Taking medication(s): Insulin glargine 25 units twice daily, sliding scale Humalog side effects: None.  He had a hypoglycemic episode that was so significant last year that he lost awareness/consciousness at work.  He notes that this was when he attempted to switch to third shift and his insulin was changed.  At his last office visit with his previous PCP they had loosened up on his Basaglar regimen such that he was only to take 20 units twice daily.  He notes that his blood sugars were a little bit higher that he was comfortable with and he has since titrated up to 25 units twice daily.  Blood sugars have been under much better control since that time.  No recurrent hypoglycemic episodes.  He is asking that we write a note asking that he does not be put back on third shift as he is afraid that this may precipitate another significant hypoglycemic episode.  Of note, he does keep sugary foods on his person in the event that he has a low blood sugar.  Last eye exam: 1.5 years ago, See Dr Marin Comment Last foot exam: Due Last A1c:  Lab Results  Component Value Date   HGBA1C 7.6 (H) 07/27/2018   Nephropathy screen indicated?: Due Last flu, zoster and/or pneumovax:  Immunization History  Administered Date(s) Administered  . DTaP 07/20/2016  . Pneumococcal Polysaccharide-23 11/03/2017  . Td 07/20/2016    ROS: denies dizziness, polyuria, polydipsia, unintended weight loss/gain, foot ulcerations, numbness or tingling in extremities or chest pain.  2. Chronic pain Patient is seen by pain  management for chronic pain related to a significant fracture of the right ankle.  He has had 3 subsequent surgeries none of which resolved his pain in the ankle.  There was consideration for fusion but he did not feel that this was something that he would be able to deal with long-term.  He is being treated with MS Contin 15 mg p.o. twice daily and Norco 10 3 times daily.  He notes this is actually decreased from his previous dosing in [place] where he was on 30 mg of MS Contin twice daily and 10 mg of Norco 4-5 times daily.  3.  Anxiety disorder Patient reports longstanding history of anxiety disorder.  He has been on chronic Xanax for over 20 years now.  He has been treated with various SSRIs and Wellbutrin throughout the years none of which have controlled his symptoms.  He is very well aware of the interaction between benzodiazepines and opioids and tries to use the medications only as directed and very sparingly.  He has had a significant reduction in the Xanax.  He is to be on this medication 4 times daily and is now down to 2 times daily with an occasional extra tablet as needed for panic disorder.  Denies any respiratory depression, dizziness, falls, altered mentation.  He has refills prescribed by PCP and does not need this at this time.  Medical history is significant for marijuana use as a young man.  No drug use now.  ROS: Per HPI  No Known Allergies Past Medical History:  Diagnosis Date  . Anxiety   . Diabetes mellitus without complication ([place])     Current Outpatient Medications:  .  ALPRAZolam (XANAX) 1 MG tablet, 1 tab 2-3 times a day as needed for anxiety, Disp: 80 tablet, Rfl: 5 .  B-D INS SYRINGE 0.5CC/30GX1/2" 30G X 1/2" 0.5 ML MISC, 1 EACH BY DOES NOT APPLY ROUTE 2 (TWO) TIMES DAILY., Disp: , Rfl: 2 .  blood glucose meter kit and supplies KIT, Dispense based on patient and insurance preference. Use up to four times daily as directed. (FOR ICD-9 250.00, 250.01)., Disp: 1  each, Rfl: 0 .  Blood Glucose Monitoring Suppl (CONTOUR NEXT MONITOR) w/Device KIT, USE TO TEST BS 5 TIMES A DAY AND AS NEEDED, Disp: 1 kit, Rfl: 0 .  Dextrose, Diabetic Use, (GLUCOSE) 15 HC/62BJ GEL, Take 1 application by mouth daily as needed., Disp: 66 g, Rfl: 5 .  gabapentin (NEURONTIN) 600 MG tablet, Take 600 mg by mouth 3 (three) times daily., Disp: , Rfl: 3 .  glucose blood (ONE TOUCH ULTRA TEST) test strip, Use to check BS 5 times daily, Disp: 500 each, Rfl: 3 .  HYDROcodone-acetaminophen (NORCO) 10-325 MG tablet, Take 1 tablet by mouth every 8 (eight) hours as needed., Disp: , Rfl:  .  Insulin Glargine (BASAGLAR KWIKPEN) 100 UNIT/ML SOPN, Inject 0.25 mLs (25 Units total) into the skin 2 (two) times daily., Disp: 15 pen, Rfl: 2 .  insulin lispro (HUMALOG) 100 UNIT/ML injection, 5-20 units before meals per sliding scale, Disp: 30 mL, Rfl: 4 .  Insulin Pen Needle 32G X 4 MM MISC, Use with insulin pen daily, Disp: 100 each, Rfl: 3 .  Insulin Syringe-Needle U-100 (B-D INS SYRINGE 0.5CC/30GX1/2") 30G X 1/2" 0.5 ML MISC, 1 each by Does not apply route 2 (two) times daily., Disp: 100 each, Rfl: 2 .  morphine (MS CONTIN) 15 MG 12 hr tablet, Take 15 mg by mouth every 12 (twelve) hours., Disp: , Rfl:  .  NARCAN 4 MG/0.1ML LIQD nasal spray kit, 1 (ONE) SPRAY AS NEEDED, Disp: , Rfl: 3 .  pravastatin (PRAVACHOL) 40 MG tablet, Take 1 tablet (40 mg total) by mouth daily. (Patient not taking: Reported on 10/26/2018), Disp: 90 tablet, Rfl: 3 Social History   Socioeconomic History  . Marital status: Married    Spouse name: Not on file  . Number of children: Not on file  . Years of education: Not on file  . Highest education level: Not on file  Occupational History  . Not on file  Social Needs  . Financial resource strain: Not on file  . Food insecurity:    Worry: Not on file    Inability: Not on file  . Transportation needs:    Medical: Not on file    Non-medical: Not on file  Tobacco Use  .  Smoking status: Current Every Day Smoker    Packs/day: 0.25    Types: Cigarettes  . Smokeless tobacco: Never Used  Substance and Sexual Activity  . Alcohol use: No  . Drug use: No  . Sexual activity: Yes  Lifestyle  . Physical activity:    Days per week: Not on file    Minutes per session: Not on file  . Stress: Not on file  Relationships  . Social connections:    Talks on phone: Not on file    Gets together: Not on file  Attends religious service: Not on file    Active member of club or organization: Not on file    Attends meetings of clubs or organizations: Not on file    Relationship status: Not on file  . Intimate partner violence:    Fear of current or ex partner: Not on file    Emotionally abused: Not on file    Physically abused: Not on file    Forced sexual activity: Not on file  Other Topics Concern  . Not on file  Social History Narrative  . Not on file   No family history on file.  Objective: Office vital signs reviewed. BP 125/74   Pulse 81   Temp (!) 97 F (36.1 C) (Oral)   Ht 5' 11"  (1.803 m)   Wt 267 lb (121.1 kg)   BMI 37.24 kg/m   Physical Examination:  General: Awake, alert, well nourished, No acute distress HEENT: Normal. Sclera white, MMM Cardio: regular rate and rhythm, S1S2 heard, no murmurs appreciated Pulm: clear to auscultation bilaterally, no wheezes, rhonchi or rales; normal work of breathing on room air Extremities: warm, well perfused, No edema, cyanosis or clubbing; +2 pulses bilaterally MSK: normal gait and station Skin: dry; intact; no rashes or lesions; multiple tattoos Neuro: no tremor.  Depression screen [facility] 2/9 10/26/2018 07/27/2018 04/22/2018 12/10/2017 11/27/2017  Decreased Interest 0 0 0 0 0  Down, Depressed, Hopeless 0 0 0 0 0  PHQ - 2 Score 0 0 0 0 0  Altered sleeping 0 - - - -  Tired, decreased energy 0 - - - -  Change in appetite 0 - - - -  Feeling bad or failure about yourself  0 - - - -  Trouble concentrating 0 -  - - -  Moving slowly or fidgety/restless 0 - - - -  Suicidal thoughts 0 - - - -  PHQ-9 Score 0 - - - -  Assessment/ Plan: 43 y.o. male   1. Type 1 diabetes mellitus without complication (HCC) He is now at goal with A1c of 6.8 today.  Continue current insulin regimen.  No refills needed at this time.  Continue statin. - Bayer DCA Hb A1c Waived  2. Hyperlipidemia due to type 1 diabetes mellitus (HCC) No refills needed on statin.  Stable.  3. Generalized anxiety disorder Stable.  Continues to use benzodiazepines in the setting of chronic opioid use as well.  It sounds like he is done a pretty good job at tapering down on both the opioid and benzodiazepine.  He should have at least 1-2 more refills of the benzodiazepine and we discussed that we will need to follow-up in person for refills of this ongoing as well as complete a controlled substance contract when he is due for refills.  He voiced good understanding will follow-up in the next 2 months.  4. Chronic pain due to trauma Sees pain management.  Pain is fairly well controlled.   Orders Placed This Encounter  Procedures  . Bayer DCA Hb A1c Waived  . CMP14+EGFR  . Lipid Panel   No orders of the defined types were placed in this encounter.    Janora Norlander, DO [place] [phone]

## 2018-10-27 ENCOUNTER — Ambulatory Visit: Payer: Self-pay | Admitting: Family Medicine

## 2018-10-27 LAB — CMP14+EGFR
ALT: 22 IU/L (ref 0–44)
AST: 23 IU/L (ref 0–40)
Albumin/Globulin Ratio: 1.9 (ref 1.2–2.2)
Albumin: 4.5 g/dL (ref 4.0–5.0)
Alkaline Phosphatase: 64 IU/L (ref 39–117)
BUN/Creatinine Ratio: 17 (ref 9–20)
BUN: 19 mg/dL (ref 6–24)
Bilirubin Total: 0.5 mg/dL (ref 0.0–1.2)
CO2: 24 mmol/L (ref 20–29)
Calcium: 9.8 mg/dL (ref 8.7–10.2)
Chloride: 102 mmol/L (ref 96–106)
Creatinine, Ser: 1.09 mg/dL (ref 0.76–1.27)
GFR calc Af Amer: 96 mL/min/{1.73_m2} (ref >59)
GFR calc non Af Amer: 83 mL/min/{1.73_m2} (ref >59)
Globulin, Total: 2.4 g/dL (ref 1.5–4.5)
Glucose: 109 mg/dL — ABNORMAL HIGH (ref 65–99)
Potassium: 4.3 mmol/L (ref 3.5–5.2)
Sodium: 141 mmol/L (ref 134–144)
Total Protein: 6.9 g/dL (ref 6.0–8.5)

## 2018-10-27 LAB — LIPID PANEL
Chol/HDL Ratio: 4.6 ratio (ref 0.0–5.0)
Cholesterol, Total: 196 mg/dL (ref 100–199)
HDL: 43 mg/dL (ref >39)
LDL Calculated: 135 mg/dL — ABNORMAL HIGH (ref 0–99)
Triglycerides: 91 mg/dL (ref 0–149)
VLDL CHOLESTEROL CAL: 18 mg/dL (ref 5–40)

## 2018-12-14 ENCOUNTER — Other Ambulatory Visit: Payer: Self-pay | Admitting: Pediatrics

## 2018-12-14 DIAGNOSIS — E109 Type 1 diabetes mellitus without complications: Secondary | ICD-10-CM

## 2018-12-29 ENCOUNTER — Telehealth: Payer: Self-pay | Admitting: Family Medicine

## 2019-01-29 ENCOUNTER — Other Ambulatory Visit: Payer: Self-pay | Admitting: Family Medicine

## 2019-01-29 DIAGNOSIS — E109 Type 1 diabetes mellitus without complications: Secondary | ICD-10-CM

## 2019-02-18 ENCOUNTER — Other Ambulatory Visit: Payer: Self-pay | Admitting: *Deleted

## 2019-02-18 DIAGNOSIS — Z79899 Other long term (current) drug therapy: Secondary | ICD-10-CM

## 2019-03-05 ENCOUNTER — Telehealth: Payer: Self-pay | Admitting: *Deleted

## 2019-03-05 NOTE — Telephone Encounter (Signed)
I sympathize with his situation.  However, in March, we discussed he would HAVE to be seen for refills of this medication.  Since I have never rx'd this medication, it is ILLEGAL for me to prescribe with out seeing him face to face for the medication.  Ok to schedule him on a same day appt if needed if this is better for his schedule.

## 2019-03-05 NOTE — Telephone Encounter (Signed)
Left message to please call our office to schedule an appointment . 

## 2019-03-05 NOTE — Telephone Encounter (Signed)
Patient called to schedule an appointment to get refills on Xanax.

## 2019-03-05 NOTE — Telephone Encounter (Signed)
Patient aware, needs to schedule an appointment for refill of xanax.  He says he knows about contract and had signed with Dr. Evette Doffing.He requests that provider give him refill until he can schedule an appointment.  This medicine is imperative to continue in his job. He says he can not get off from work for quite awhile.  Please advise.

## 2019-03-11 IMAGING — DX DG WRIST COMPLETE 3+V*L*
3 series · 3 of 3 positions shown · non-contrast
Comparison: None.

CLINICAL DATA: Wrist injury with persistent pain.

EXAM:
LEFT WRIST - COMPLETE 3+ VIEW

[wrist ap]
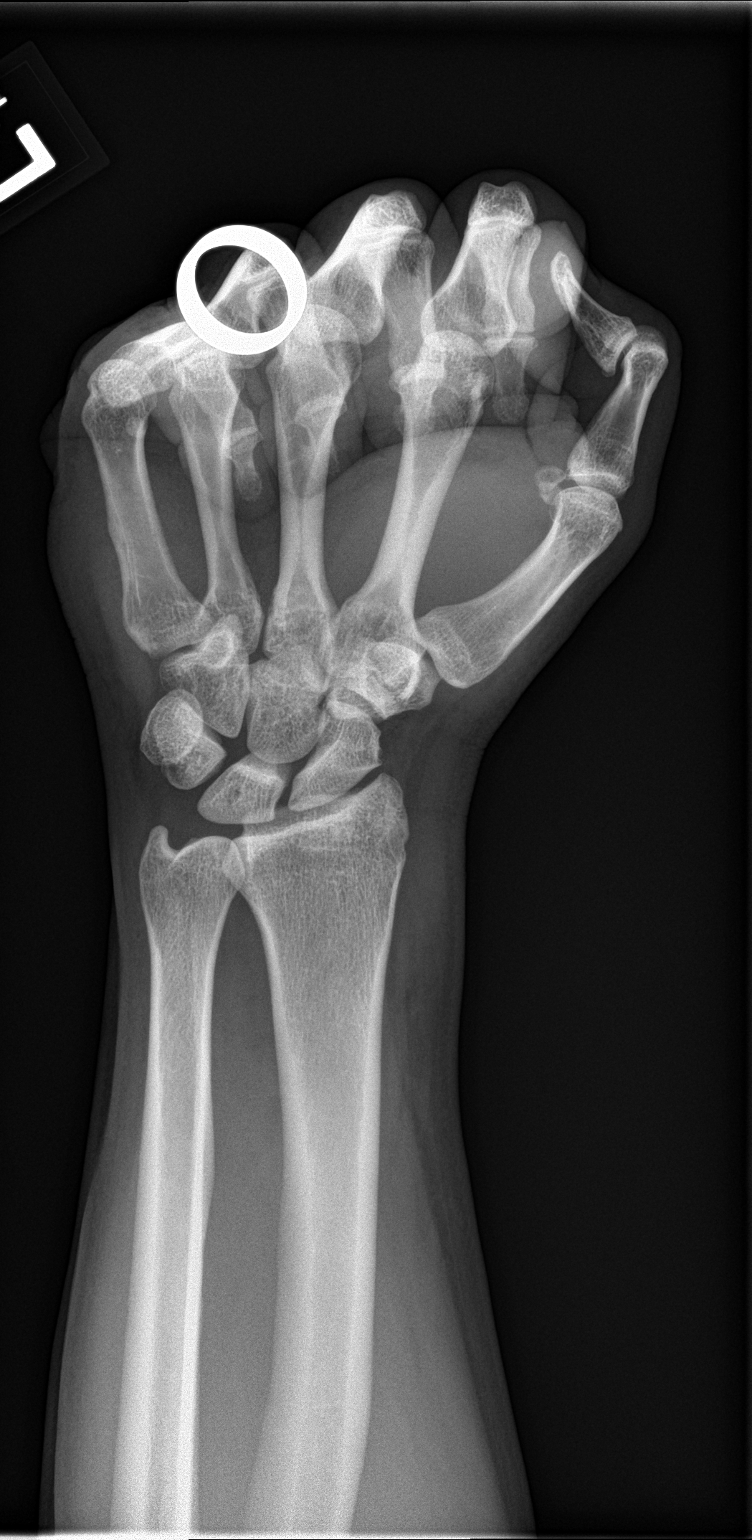

[wrist lat]
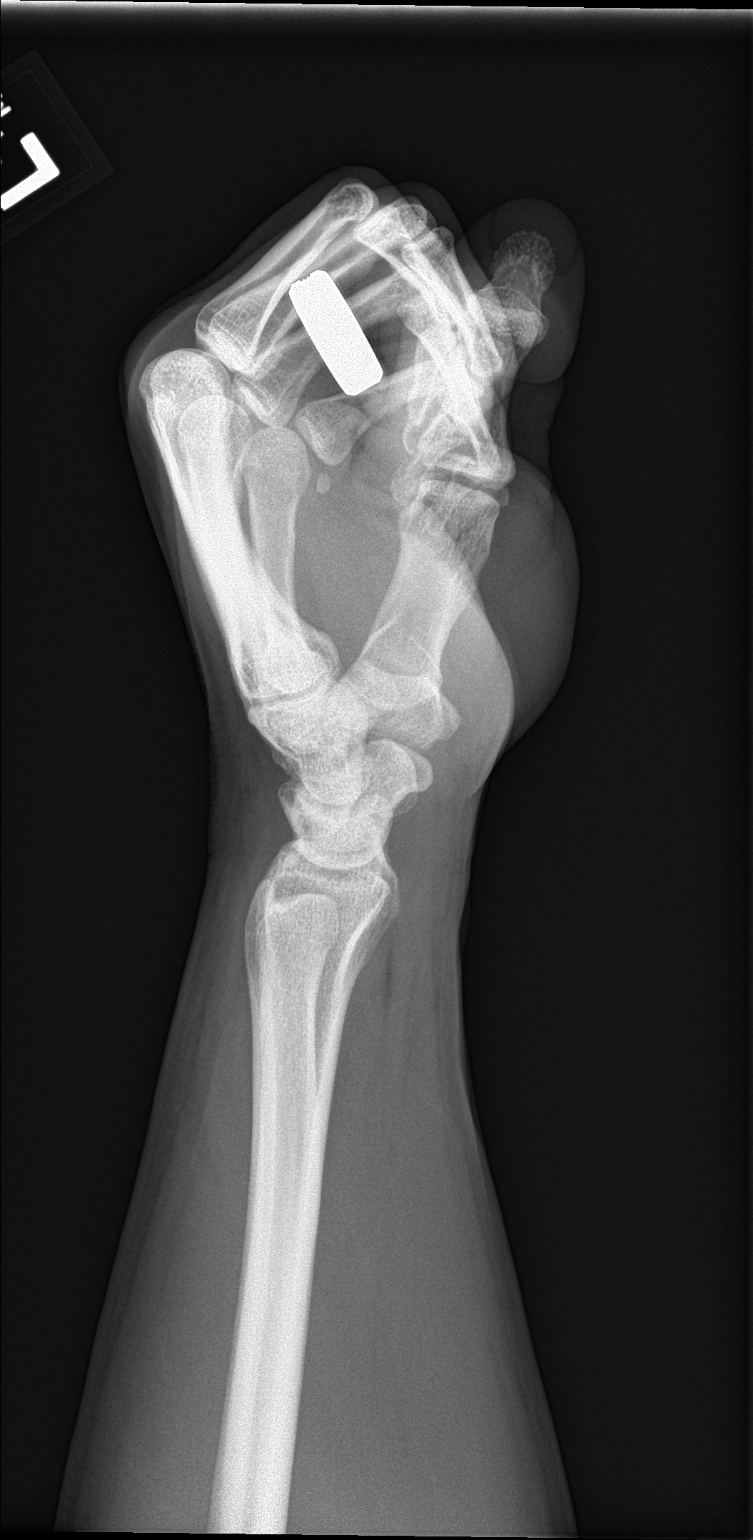

[wrist obl]
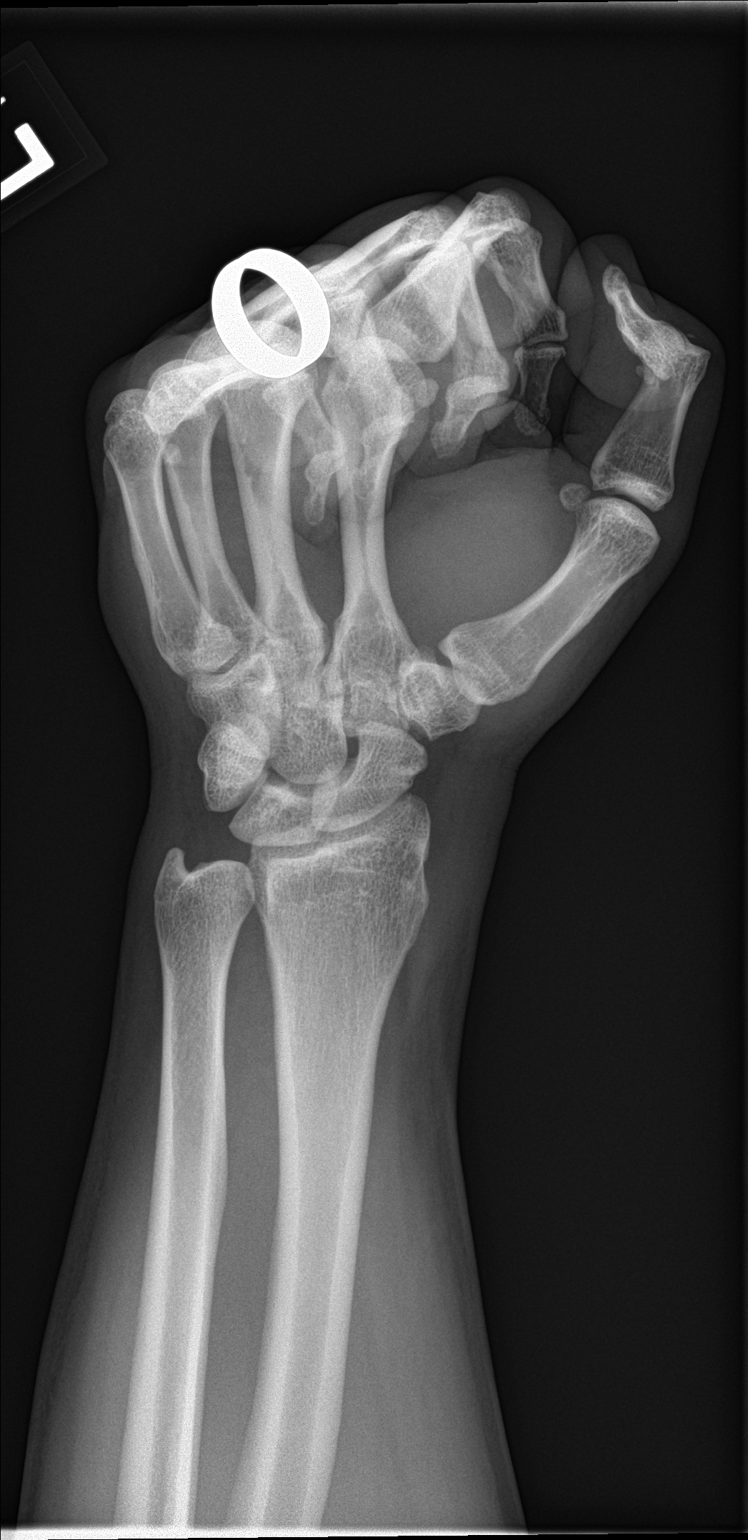

[3 of 3 positions shown; findings below may reference images not displayed]

FINDINGS: There is no evidence of fracture or dislocation. There is no
evidence of arthropathy or other focal bone abnormality. Soft
tissues are unremarkable.
IMPRESSION: Negative.

## 2019-03-26 ENCOUNTER — Other Ambulatory Visit: Payer: Self-pay | Admitting: Family Medicine

## 2019-03-26 DIAGNOSIS — E109 Type 1 diabetes mellitus without complications: Secondary | ICD-10-CM

## 2019-03-31 ENCOUNTER — Ambulatory Visit (INDEPENDENT_AMBULATORY_CARE_PROVIDER_SITE_OTHER): Payer: Managed Care, Other (non HMO) | Admitting: Family Medicine

## 2019-03-31 ENCOUNTER — Encounter: Payer: Self-pay | Admitting: Family Medicine

## 2019-03-31 ENCOUNTER — Other Ambulatory Visit: Payer: Self-pay

## 2019-03-31 VITALS — BP 117/79 | HR 89 | Temp 97.1°F | Ht 71.0 in | Wt 257.0 lb

## 2019-03-31 DIAGNOSIS — F411 Generalized anxiety disorder: Secondary | ICD-10-CM

## 2019-03-31 DIAGNOSIS — Z79899 Other long term (current) drug therapy: Secondary | ICD-10-CM

## 2019-03-31 DIAGNOSIS — E109 Type 1 diabetes mellitus without complications: Secondary | ICD-10-CM | POA: Diagnosis not present

## 2019-03-31 DIAGNOSIS — E1069 Type 1 diabetes mellitus with other specified complication: Secondary | ICD-10-CM | POA: Diagnosis not present

## 2019-03-31 DIAGNOSIS — E785 Hyperlipidemia, unspecified: Secondary | ICD-10-CM

## 2019-03-31 LAB — BAYER DCA HB A1C WAIVED: HB A1C (BAYER DCA - WAIVED): 7.1 % — ABNORMAL HIGH (ref <7.0)

## 2019-03-31 MED ORDER — ALPRAZOLAM 1 MG PO TABS: ORAL_TABLET | ORAL | 3 refills | Status: DC

## 2019-03-31 NOTE — Patient Instructions (Addendum)
Your A1c has gone up a little bit since last visit. It was 7.1 today.  This is technically uncontrolled diabetes.  Keep a close eye on carbs and make sure you are using your insulin.  Get your diabetic eye exam done.  No labs needed today.  I will send the A1c over to [facility].  See me in 3 months to recheck A1c.  Controlled Substance Guidelines:  1. You cannot get an early refill, even it is lost.  2. You cannot get controlled medications from any other doctor, unless it is the emergency department and related to a new problem or injury.  3. You cannot use alcohol, marijuana, cocaine or any other recreational drugs while using this medication. This is very dangerous.  4. You are willing to have your urine drug tested at each visit.  5. You will not drive while using this medication, because that can put yourself and others in serious danger of an accident. 6. If any medication is stolen, then there must be a police report to verify it, or it cannot be refilled.  7. I will not prescribe these medications for longer than 3 months.  8. You must bring your pill bottle to each visit.  9. You must use the same pharmacy for all refills for the medication, unless you clear it with me beforehand.  10. You cannot share or sell this medication.

## 2019-03-31 NOTE — Progress Notes (Signed)
Subjective: CC: Type 1 DM, GAD PCP: Janora Norlander, DO Gary Stone is a 43 y.o. male presenting to clinic today for:  1. Type 1 DM w/ HLD Diagnosed in his 64s.  He has been hospitalized for DKA a few times in his lifetime.  No hospitalizations for DM1 in >10 years.  Patient reports: Glucometer: One Touch ultra, High at home: Never greater than 140; Low at home: 80.  Fasting blood sugars tend to be between 80s. Taking medication(s): Insulin glargine 25 units twice daily, sliding scale Humalog side effects: None.  He admits to eating more carbs/ sweets than normal lately.  No hypoglycemic episodes.  Last eye exam: 1.5 years ago, See Dr Marin Comment (still hasn't scheduled a visit) Last foot exam: Due Last A1c:  Lab Results  Component Value Date   HGBA1C 6.8 10/26/2018   Nephropathy screen indicated?: Due Last flu, zoster and/or pneumovax:  Immunization History  Administered Date(s) Administered  . DTaP 07/20/2016  . Pneumococcal Polysaccharide-23 11/03/2017  . Td 07/20/2016    ROS: denies dizziness, polyuria, polydipsia, unintended weight loss/gain, foot ulcerations, numbness or tingling in extremities or chest pain.   2. Anxiety disorder History: Longstanding history of anxiety disorder.  Chronic Xanax for over 20 years.  Has titrated down from 4/d to 2-3/d.  Treated with various SSRIs and Wellbutrin throughout the years, none of which have controlled his symptoms.  Also treated by pain management at [facility].  Patient reports that since last visit he has been doing well.  Many coworkers were recently laid off at [employer] and he reports feeling thankful he is still employed.  He has worked there for just over 2 years now.  Denies any respiratory problems, dizziness, falls, altered mentation.  No ETOH or drug use.  He does smoke.  ROS: Per HPI  No Known Allergies Past Medical History:  Diagnosis Date  . Anxiety   . Diabetes mellitus without complication ([place])      Current Outpatient Medications:  .  ALPRAZolam (XANAX) 1 MG tablet, 1 tab 2-3 times a day as needed for anxiety, Disp: 80 tablet, Rfl: 5 .  B-D INS SYRINGE 0.5CC/30GX1/2" 30G X 1/2" 0.5 ML MISC, 1 EACH BY DOES NOT APPLY ROUTE 2 (TWO) TIMES DAILY., Disp: , Rfl: 2 .  blood glucose meter kit and supplies KIT, Dispense based on patient and insurance preference. Use up to four times daily as directed. (FOR ICD-9 250.00, 250.01)., Disp: 1 each, Rfl: 0 .  gabapentin (NEURONTIN) 600 MG tablet, Take 600 mg by mouth 3 (three) times daily., Disp: , Rfl: 3 .  glucose blood (ONE TOUCH ULTRA TEST) test strip, Use to check BS 5 times daily, Disp: 500 each, Rfl: 3 .  HYDROcodone-acetaminophen (NORCO) 10-325 MG tablet, Take 1 tablet by mouth every 8 (eight) hours as needed., Disp: , Rfl:  .  Insulin Glargine (BASAGLAR KWIKPEN) 100 UNIT/ML SOPN, Inject 0.25 mLs (25 Units total) into the skin 2 (two) times daily., Disp: 15 pen, Rfl: 2 .  insulin lispro (HUMALOG) 100 UNIT/ML injection, INJECT 5-20 UNITS BEFORE MEALS PER SLIDING SCALE (PLEASE MAKE 3 MONTH CHECKUP), Disp: 30 mL, Rfl: 0 .  Insulin Pen Needle 32G X 4 MM MISC, Use with insulin pen daily, Disp: 100 each, Rfl: 3 .  morphine (MS CONTIN) 15 MG 12 hr tablet, Take 15 mg by mouth every 12 (twelve) hours., Disp: , Rfl:  .  NARCAN 4 MG/0.1ML LIQD nasal spray kit, 1 (ONE) SPRAY AS NEEDED, Disp: ,  Rfl: 3 .  pravastatin (PRAVACHOL) 40 MG tablet, Take 1 tablet (40 mg total) by mouth daily., Disp: 90 tablet, Rfl: 3 .  Insulin Syringe-Needle U-100 (B-D INS SYRINGE 0.5CC/30GX1/2") 30G X 1/2" 0.5 ML MISC, 1 each by Does not apply route 2 (two) times daily., Disp: 100 each, Rfl: 2 Social History   Socioeconomic History  . Marital status: Married    Spouse name: Not on file  . Number of children: Not on file  . Years of education: Not on file  . Highest education level: Not on file  Occupational History  . Not on file  Social Needs  . Financial resource strain: Not  on file  . Food insecurity    Worry: Not on file    Inability: Not on file  . Transportation needs    Medical: Not on file    Non-medical: Not on file  Tobacco Use  . Smoking status: Current Every Day Smoker    Packs/day: 0.25    Types: Cigarettes  . Smokeless tobacco: Never Used  Substance and Sexual Activity  . Alcohol use: No  . Drug use: No  . Sexual activity: Yes  Lifestyle  . Physical activity    Days per week: Not on file    Minutes per session: Not on file  . Stress: Not on file  Relationships  . Social Herbalist on phone: Not on file    Gets together: Not on file    Attends religious service: Not on file    Active member of club or organization: Not on file    Attends meetings of clubs or organizations: Not on file    Relationship status: Not on file  . Intimate partner violence    Fear of current or ex partner: Not on file    Emotionally abused: Not on file    Physically abused: Not on file    Forced sexual activity: Not on file  Other Topics Concern  . Not on file  Social History Narrative  . Not on file   Family History  Problem Relation Age of Onset  . Healthy Mother   . Healthy Father     Objective: Office vital signs reviewed. BP 117/79   Pulse 89   Temp (!) 97.1 F (36.2 C) (Temporal)   Ht 5' 11"  (1.803 m)   Wt 257 lb (116.6 kg)   BMI 35.84 kg/m   Physical Examination:  General: Awake, alert, well nourished, No acute distress HEENT: Normal. Sclera white, MMM Cardio: regular rate and rhythm, S1S2 heard, no murmurs appreciated Pulm: mild expiratory wheezes throughout. No rhonchi or rales; normal work of breathing on room air Extremities: warm, well perfused, No edema, cyanosis or clubbing; +2 pulses bilaterally Neuro: see DM foot Diabetic Foot Exam - Simple   Simple Foot Form Diabetic Foot exam was performed with the following findings: Yes 03/31/2019  3:51 PM  Visual Inspection No deformities, no ulcerations, no other skin  breakdown bilaterally: Yes Sensation Testing Intact to touch and monofilament testing bilaterally: Yes Pulse Check Posterior Tibialis and Dorsalis pulse intact bilaterally: Yes Comments    Psych: Mood stable, speech normal, affect appropriate, good eye contact.  Depression screen [facility] 2/9 03/31/2019 10/26/2018 07/27/2018 04/22/2018 12/10/2017  Decreased Interest 0 0 0 0 0  Down, Depressed, Hopeless 0 0 0 0 0  PHQ - 2 Score 0 0 0 0 0  Altered sleeping 0 0 - - -  Tired, decreased energy 0 0 - - -  Change in appetite 0 0 - - -  Feeling bad or failure about yourself  0 0 - - -  Trouble concentrating 0 0 - - -  Moving slowly or fidgety/restless 0 0 - - -  Suicidal thoughts 0 0 - - -  PHQ-9 Score 0 0 - - -   GAD 7 : Generalized Anxiety Score 03/31/2019  Nervous, Anxious, on Edge 1  Control/stop worrying 1  Worry too much - different things 0  Trouble relaxing 0  Restless 0  Easily annoyed or irritable 1  Afraid - awful might happen 0  Total GAD 7 Score 3  Anxiety Difficulty Not difficult at all     Assessment/ Plan: 43 y.o. male   1. Type 1 diabetes mellitus without complication (HCC) Not at goal with A1c of 7.1 today.  He identified increased carbohydrate intake as the source of the rise.  He will start becoming very strict.  For now continue current insulin regimen with carb coverage.  He will follow-up in 3 months rather than 6 given uncontrolled DM today.  I have also encouraged him to schedule his eye exam.  Foot exam completed today - Bayer DCA Hb A1c Waived  2. Hyperlipidemia due to type 1 diabetes mellitus (HCC) Continue statin.  3. Generalized anxiety disorder Controlled with current regimen.  He has a good understanding of the risks of concomitant Xanax and opioid use and he accepts the risks.  Given intolerance/failure of previous methods of anxiety control and his success in reducing to lowest effective dose I think that we can continue this medication for now.  The  national card database was reviewed and there were no red flags.  UDS obtained and controlled substance contract completed as per office protocol.  I have asked that he complete a release of information form so that we may send records from our office to [place] and vice versa.  We can utilize the urine drug screens from [place] going forward  4. Chronic prescription benzodiazepine use - ToxASSURE Select 13 (MW), Urine - ALPRAZolam (XANAX) 1 MG tablet; 1 tab 2-3 times a day as needed for anxiety  Dispense: 80 tablet; Refill: 3  5. High risk medication use - ToxASSURE Select 13 (MW), Urine  6. Controlled substance agreement signed - ToxASSURE Select 13 (MW), Urine    Orders Placed This Encounter  Procedures  . Bayer DCA Hb A1c Waived  . ToxASSURE Select 13 (MW), Urine   Meds ordered this encounter  Medications  . ALPRAZolam (XANAX) 1 MG tablet    Sig: 1 tab 2-3 times a day as needed for anxiety    Dispense:  80 tablet    Refill:  [place], [place] [phone]

## 2019-04-03 LAB — TOXASSURE SELECT 13 (MW), URINE

## 2019-05-03 ENCOUNTER — Other Ambulatory Visit: Payer: Self-pay | Admitting: *Deleted

## 2019-05-03 DIAGNOSIS — E109 Type 1 diabetes mellitus without complications: Secondary | ICD-10-CM

## 2019-05-03 MED ORDER — BASAGLAR KWIKPEN 100 UNIT/ML ~~LOC~~ SOPN: 25.0000 [IU] | PEN_INJECTOR | Freq: Two times a day (BID) | SUBCUTANEOUS | 2 refills | Status: DC

## 2019-05-15 ENCOUNTER — Other Ambulatory Visit: Payer: Self-pay | Admitting: Family Medicine

## 2019-05-15 DIAGNOSIS — E109 Type 1 diabetes mellitus without complications: Secondary | ICD-10-CM

## 2019-07-06 ENCOUNTER — Ambulatory Visit: Payer: Managed Care, Other (non HMO) | Admitting: Family Medicine

## 2019-07-06 ENCOUNTER — Encounter: Payer: Self-pay | Admitting: Family Medicine

## 2019-08-03 ENCOUNTER — Encounter: Payer: Self-pay | Admitting: Nurse Practitioner

## 2019-08-03 ENCOUNTER — Other Ambulatory Visit: Payer: Self-pay

## 2019-08-03 ENCOUNTER — Ambulatory Visit (INDEPENDENT_AMBULATORY_CARE_PROVIDER_SITE_OTHER): Payer: 59 | Admitting: Nurse Practitioner

## 2019-08-03 VITALS — BP 158/100 | HR 84 | Temp 98.6°F | Resp 20 | Ht 71.0 in | Wt 256.0 lb

## 2019-08-03 DIAGNOSIS — G8929 Other chronic pain: Secondary | ICD-10-CM | POA: Diagnosis not present

## 2019-08-03 DIAGNOSIS — M25511 Pain in right shoulder: Secondary | ICD-10-CM

## 2019-08-03 MED ORDER — CYCLOBENZAPRINE HCL 10 MG PO TABS: 10.0000 mg | ORAL_TABLET | Freq: Three times a day (TID) | ORAL | 1 refills | Status: DC | PRN

## 2019-08-03 MED ORDER — PREDNISONE 20 MG PO TABS: ORAL_TABLET | ORAL | 0 refills | Status: DC

## 2019-08-03 NOTE — Progress Notes (Signed)
   Subjective:    Patient ID: Gary Stone, male    DOB: 1975/12/09, 44 y.o.   MRN: [id number]   Chief Complaint: Back Pain (upper right back pain x 1 week )   HPI Patient come s in c/o pain in mid upper back. Started 1 week ago. Rates pain 7-8/10 currently. Pain does not radiate. He deneis any upper ext numbness or tingling. He goes to [facility] for pain management. He is on hydrocodone and morphine. He says his pain meds have not helped. He has tried hot and cold patches which also has not helped. Being very still decreases pain but movementt increases pain.   Review of Systems  Constitutional: Negative for diaphoresis.  Eyes: Negative for pain.  Respiratory: Negative for shortness of breath.   Cardiovascular: Negative for chest pain, palpitations and leg swelling.  Gastrointestinal: Negative for abdominal pain.  Endocrine: Negative for polydipsia.  Musculoskeletal: Positive for back pain.  Skin: Negative for rash.  Neurological: Negative for dizziness, weakness and headaches.  Hematological: Does not bruise/bleed easily.  All other systems reviewed and are negative.      Objective:   Physical Exam Vitals and nursing note reviewed.  Constitutional:      Appearance: Normal appearance.  Cardiovascular:     Rate and Rhythm: Normal rate and regular rhythm.     Heart sounds: Normal heart sounds.  Pulmonary:     Breath sounds: Normal breath sounds.  Musculoskeletal:     Comments: Point tenderness around around right scapula Grips equal bil Motor strength and sensation distally intact.  Skin:    General: Skin is warm.  Neurological:     General: No focal deficit present.     Mental Status: He is alert and oriented to person, place, and time.  Psychiatric:        Mood and Affect: Mood normal.        Behavior: Behavior normal.    BP (!) 158/100 (BP Location: Right Wrist, Cuff Size: Normal)   Pulse 84   Temp 98.6 F (37 C)   Resp 20   Ht 5\' 11"  (1.803 m)   Wt  256 lb (116.1 kg)   SpO2 98%   BMI 35.70 kg/m         Assessment & Plan:  Gary Stone in today with chief complaint of Back Pain (upper right back pain x 1 week )   1. Chronic periscapular pain on right side Moist heat Rest Message area Continue pain meds as rx Watch for elevation in blood sugar while on steroids- increase basaglar 30 u BID while on steroids - predniSONE (DELTASONE) 20 MG tablet; 2 po at sametime daily for 5 days  Dispense: 10 tablet; Refill: 0 - cyclobenzaprine (FLEXERIL) 10 MG tablet; Take 1 tablet (10 mg total) by mouth 3 (three) times daily as needed for muscle spasms.  Dispense: 30 tablet; Refill: 1  Mary-Margaret Levi Aland, FNP

## 2019-08-03 NOTE — Patient Instructions (Signed)
Acute Back Pain, Adult Acute back pain is sudden and usually short-lived. It is often caused by an injury to the muscles and tissues in the back. The injury may result from:  A muscle or ligament getting overstretched or torn (strained). Ligaments are tissues that connect bones to each other. Lifting something improperly can cause a back strain.  Wear and tear (degeneration) of the spinal disks. Spinal disks are circular tissue that provides cushioning between the bones of the spine (vertebrae).  Twisting motions, such as while playing sports or doing yard work.  A hit to the back.  Arthritis. You may have a physical exam, lab tests, and imaging tests to find the cause of your pain. Acute back pain usually goes away with rest and home care. Follow these instructions at home: Managing pain, stiffness, and swelling  Take over-the-counter and prescription medicines only as told by your health care provider.  Your health care provider may recommend applying ice during the first 24-48 hours after your pain starts. To do this: ? Put ice in a plastic bag. ? Place a towel between your skin and the bag. ? Leave the ice on for 20 minutes, 2-3 times a day.  If directed, apply heat to the affected area as often as told by your health care provider. Use the heat source that your health care provider recommends, such as a moist heat pack or a heating pad. ? Place a towel between your skin and the heat source. ? Leave the heat on for 20-30 minutes. ? Remove the heat if your skin turns bright red. This is especially important if you are unable to feel pain, heat, or cold. You have a greater risk of getting burned. Activity   Do not stay in bed. Staying in bed for more than 1-2 days can delay your recovery.  Sit up and stand up straight. Avoid leaning forward when you sit, or hunching over when you stand. ? If you work at a desk, sit close to it so you do not need to lean over. Keep your chin tucked  in. Keep your neck drawn back, and keep your elbows bent at a right angle. Your arms should look like the letter "L." ? Sit high and close to the steering wheel when you drive. Add lower back (lumbar) support to your car seat, if needed.  Take short walks on even surfaces as soon as you are able. Try to increase the length of time you walk each day.  Do not sit, drive, or stand in one place for more than 30 minutes at a time. Sitting or standing for long periods of time can put stress on your back.  Do not drive or use heavy machinery while taking prescription pain medicine.  Use proper lifting techniques. When you bend and lift, use positions that put less stress on your back: ? Bend your knees. ? Keep the load close to your body. ? Avoid twisting.  Exercise regularly as told by your health care provider. Exercising helps your back heal faster and helps prevent back injuries by keeping muscles strong and flexible.  Work with a physical therapist to make a safe exercise program, as recommended by your health care provider. Do any exercises as told by your physical therapist. Lifestyle  Maintain a healthy weight. Extra weight puts stress on your back and makes it difficult to have good posture.  Avoid activities or situations that make you feel anxious or stressed. Stress and anxiety increase muscle   tension and can make back pain worse. Learn ways to manage anxiety and stress, such as through exercise. General instructions  Sleep on a firm mattress in a comfortable position. Try lying on your side with your knees slightly bent. If you lie on your back, put a pillow under your knees.  Follow your treatment plan as told by your health care provider. This may include: ? Cognitive or behavioral therapy. ? Acupuncture or massage therapy. ? Meditation or yoga. Contact a health care provider if:  You have pain that is not relieved with rest or medicine.  You have increasing pain going down  into your legs or buttocks.  Your pain does not improve after 2 weeks.  You have pain at night.  You lose weight without trying.  You have a fever or chills. Get help right away if:  You develop new bowel or bladder control problems.  You have unusual weakness or numbness in your arms or legs.  You develop nausea or vomiting.  You develop abdominal pain.  You feel faint. Summary  Acute back pain is sudden and usually short-lived.  Use proper lifting techniques. When you bend and lift, use positions that put less stress on your back.  Take over-the-counter and prescription medicines and apply heat or ice as directed by your health care provider. This information is not intended to replace advice given to you by your health care provider. Make sure you discuss any questions you have with your health care provider. Document Revised: 11/03/2018 Document Reviewed: 02/26/2017 Elsevier Patient Education  2020 Elsevier Inc.  

## 2019-08-06 ENCOUNTER — Other Ambulatory Visit: Payer: Self-pay | Admitting: Nurse Practitioner

## 2019-09-08 ENCOUNTER — Other Ambulatory Visit: Payer: Self-pay | Admitting: Family Medicine

## 2019-09-08 DIAGNOSIS — E109 Type 1 diabetes mellitus without complications: Secondary | ICD-10-CM

## 2019-09-09 ENCOUNTER — Other Ambulatory Visit: Payer: Self-pay | Admitting: Family Medicine

## 2019-09-09 DIAGNOSIS — F411 Generalized anxiety disorder: Secondary | ICD-10-CM

## 2019-09-09 DIAGNOSIS — Z79899 Other long term (current) drug therapy: Secondary | ICD-10-CM

## 2019-09-15 ENCOUNTER — Telehealth: Payer: Self-pay | Admitting: Family Medicine

## 2019-09-15 NOTE — Telephone Encounter (Signed)
Appt rescheduled

## 2019-09-20 ENCOUNTER — Telehealth (INDEPENDENT_AMBULATORY_CARE_PROVIDER_SITE_OTHER): Payer: 59 | Admitting: Family Medicine

## 2019-09-20 DIAGNOSIS — M25511 Pain in right shoulder: Secondary | ICD-10-CM | POA: Diagnosis not present

## 2019-09-20 DIAGNOSIS — F411 Generalized anxiety disorder: Secondary | ICD-10-CM | POA: Diagnosis not present

## 2019-09-20 DIAGNOSIS — Z79899 Other long term (current) drug therapy: Secondary | ICD-10-CM

## 2019-09-20 DIAGNOSIS — G8929 Other chronic pain: Secondary | ICD-10-CM | POA: Diagnosis not present

## 2019-09-20 MED ORDER — ALPRAZOLAM 1 MG PO TABS: ORAL_TABLET | ORAL | 1 refills | Status: DC

## 2019-09-20 MED ORDER — TIZANIDINE HCL 4 MG PO TABS: 2.0000 mg | ORAL_TABLET | Freq: Four times a day (QID) | ORAL | 0 refills | Status: DC | PRN

## 2019-09-20 NOTE — Progress Notes (Signed)
MyChart Video visit  Subjective: CC: GAD PCP: Gottschalk, Ashly M, DO HPI:Gary Stone is a 43 y.o. male calls for video/telephone consult today. Patient provides verbal consent for consult held via video.  Due to COVID-19 pandemic this visit was conducted virtually. This visit type was conducted due to national recommendations for restrictions regarding the COVID-19 Pandemic (e.g. social distancing, sheltering in place) in an effort to limit this patient's exposure and mitigate transmission in our community. All issues noted in this document were discussed and addressed.  A physical exam was not performed with this format.   Location of patient: work Location of provider: Working remotely from home Others present for call: none  1. Generalized anxiety disorder Reports stability of anxiety disorder.  He continues to use alprazolam on average twice daily with sometimes 3 times daily.  He is treated with chronic opioids as well by pain provider.  He denies any excessive daytime sleepiness, respiratory depression, mental status changes including memory loss, falls, dizziness.  He has a new baby, so sleep has been a little harder.  2.  Muscle spasm/back pain Patient with right-sided upper back pain has been ongoing since January.  He notes that it waxes and wanes in severity.  His pain was somewhat responsive to Flexeril but he admits to having taken up to 2 of them at a time in efforts to get relief.  He is also trying various OTC remedies including stretching but so far symptoms continue to flare.  He thinks his job duties may be causing these flares.  ROS: Per HPI  No Known Allergies Past Medical History:  Diagnosis Date  . Anxiety   . Diabetes mellitus without complication (HCC)     Current Outpatient Medications:  .  ALPRAZolam (XANAX) 1 MG tablet, 1 tab 2-3 times a day as needed for anxiety, Disp: 80 tablet, Rfl: 3 .  B-D INS SYRINGE 0.5CC/30GX1/2" 30G X 1/2" 0.5 ML MISC, 1 EACH  BY DOES NOT APPLY ROUTE 2 (TWO) TIMES DAILY., Disp: , Rfl: 2 .  blood glucose meter kit and supplies KIT, Dispense based on patient and insurance preference. Use up to four times daily as directed. (FOR ICD-9 250.00, 250.01)., Disp: 1 each, Rfl: 0 .  cyclobenzaprine (FLEXERIL) 10 MG tablet, Take 1 tablet (10 mg total) by mouth 3 (three) times daily as needed for muscle spasms., Disp: 30 tablet, Rfl: 1 .  gabapentin (NEURONTIN) 600 MG tablet, Take 600 mg by mouth 3 (three) times daily., Disp: , Rfl: 3 .  glucose blood (ONETOUCH ULTRA) test strip, Test BS 5 times daily Dx E10.9, Disp: 500 strip, Rfl: 3 .  HYDROcodone-acetaminophen (NORCO) 10-325 MG tablet, Take 1 tablet by mouth every 8 (eight) hours as needed., Disp: , Rfl:  .  Insulin Glargine (BASAGLAR KWIKPEN) 100 UNIT/ML SOPN, Inject 0.25 mLs (25 Units total) into the skin 2 (two) times daily. (Needs to be seen before next refill), Disp: 15 mL, Rfl: 0 .  insulin lispro (HUMALOG) 100 UNIT/ML injection, INJECT 5-20 UNITS BEFORE MEALS PER SLIDING SCALE, Disp: 30 mL, Rfl: 0 .  Insulin Pen Needle 32G X 4 MM MISC, Use with insulin pen daily, Disp: 100 each, Rfl: 3 .  Insulin Syringe-Needle U-100 (B-D INS SYRINGE 0.5CC/30GX1/2") 30G X 1/2" 0.5 ML MISC, 1 each by Does not apply route 2 (two) times daily., Disp: 100 each, Rfl: 2 .  morphine (MS CONTIN) 15 MG 12 hr tablet, Take 15 mg by mouth every 12 (twelve) hours., Disp: , Rfl:  .    NARCAN 4 MG/0.1ML LIQD nasal spray kit, 1 (ONE) SPRAY AS NEEDED, Disp: , Rfl: 3 .  pravastatin (PRAVACHOL) 40 MG tablet, Take 1 tablet (40 mg total) by mouth daily., Disp: 90 tablet, Rfl: 3 .  predniSONE (DELTASONE) 20 MG tablet, 2 po at sametime daily for 5 days, Disp: 10 tablet, Rfl: 0  Gen: well groomed. Alert. No distress Pulm: normal work of breathing on room air. Psych: good eye contact, normal speech, thought process linear.  Assessment/ Plan: 43 y.o. male   1. Chronic prescription benzodiazepine use The Narcotic  Database has been reviewed.  There were no red flags.  He will schedule in office visit for ongoing renewal.  He is overdue for DM1 check up. - ALPRAZolam (XANAX) 1 MG tablet; 1 tab 2-3 times a day as needed for anxiety  Dispense: 80 tablet; Refill: 1  2. Generalized anxiety disorder - ALPRAZolam (XANAX) 1 MG tablet; 1 tab 2-3 times a day as needed for anxiety  Dispense: 80 tablet; Refill: 1  3. Chronic periscapular pain on right side Change flexeril to Zanaflex.  Advised only to use as directed.  We discussed that if symptoms are not improving by our follow-up visit we may consider x-rays of the C-spine plus or minus referral to physical therapy for further assistance - tiZANidine (ZANAFLEX) 4 MG tablet; Take 0.5-1 tablets (2-4 mg total) by mouth every 6 (six) hours as needed for muscle spasms.  Dispense: 30 tablet; Refill: 0   Start time: 9:30am End time: 9:45am  Total time spent on patient care (including telephone call/ virtual visit): 24 minutes  Ashly M Gottschalk, DO [facility] [phone]   

## 2019-09-21 ENCOUNTER — Other Ambulatory Visit: Payer: Self-pay | Admitting: *Deleted

## 2019-09-21 DIAGNOSIS — E785 Hyperlipidemia, unspecified: Secondary | ICD-10-CM

## 2019-09-21 MED ORDER — PRAVASTATIN SODIUM 40 MG PO TABS: 40.0000 mg | ORAL_TABLET | Freq: Every day | ORAL | 0 refills | Status: DC

## 2019-10-11 ENCOUNTER — Ambulatory Visit: Payer: 59 | Admitting: Family Medicine

## 2019-10-17 ENCOUNTER — Other Ambulatory Visit: Payer: Self-pay | Admitting: Family Medicine

## 2019-10-17 DIAGNOSIS — E109 Type 1 diabetes mellitus without complications: Secondary | ICD-10-CM

## 2019-10-18 NOTE — Telephone Encounter (Signed)
Left detailed message.   

## 2019-10-18 NOTE — Telephone Encounter (Signed)
Gottschalk. NTBS 30 days given 09/09/19 

## 2019-10-20 ENCOUNTER — Other Ambulatory Visit: Payer: Self-pay | Admitting: Family Medicine

## 2019-10-20 DIAGNOSIS — G8929 Other chronic pain: Secondary | ICD-10-CM

## 2019-10-20 DIAGNOSIS — M25511 Pain in right shoulder: Secondary | ICD-10-CM

## 2019-10-20 NOTE — Telephone Encounter (Signed)
Last office visit 09/20/2019 Last refill 09/20/2019, #30, no refills

## 2019-10-27 ENCOUNTER — Other Ambulatory Visit: Payer: Self-pay

## 2019-10-27 ENCOUNTER — Encounter: Payer: Self-pay | Admitting: Family Medicine

## 2019-10-27 ENCOUNTER — Ambulatory Visit (INDEPENDENT_AMBULATORY_CARE_PROVIDER_SITE_OTHER): Payer: 59 | Admitting: Family Medicine

## 2019-10-27 VITALS — BP 113/72 | HR 87 | Temp 98.6°F | Ht 71.0 in | Wt 257.0 lb

## 2019-10-27 DIAGNOSIS — E785 Hyperlipidemia, unspecified: Secondary | ICD-10-CM

## 2019-10-27 DIAGNOSIS — E1069 Type 1 diabetes mellitus with other specified complication: Secondary | ICD-10-CM

## 2019-10-27 DIAGNOSIS — E1065 Type 1 diabetes mellitus with hyperglycemia: Secondary | ICD-10-CM | POA: Diagnosis not present

## 2019-10-27 DIAGNOSIS — F411 Generalized anxiety disorder: Secondary | ICD-10-CM | POA: Diagnosis not present

## 2019-10-27 DIAGNOSIS — Z79899 Other long term (current) drug therapy: Secondary | ICD-10-CM

## 2019-10-27 LAB — BAYER DCA HB A1C WAIVED: HB A1C (BAYER DCA - WAIVED): 7.9 % — ABNORMAL HIGH (ref <7.0)

## 2019-10-27 MED ORDER — PRAVASTATIN SODIUM 40 MG PO TABS: 40.0000 mg | ORAL_TABLET | Freq: Every day | ORAL | 3 refills | Status: DC

## 2019-10-27 MED ORDER — ALPRAZOLAM 1 MG PO TABS: ORAL_TABLET | ORAL | 3 refills | Status: DC

## 2019-10-27 MED ORDER — BASAGLAR KWIKPEN 100 UNIT/ML ~~LOC~~ SOPN: 25.0000 [IU] | PEN_INJECTOR | Freq: Two times a day (BID) | SUBCUTANEOUS | 12 refills | Status: DC

## 2019-10-27 NOTE — Patient Instructions (Addendum)
You had labs performed today.  You will be contacted with the results of the labs once they are available, usually in the next 3 business days for routine lab work.  If you have an active my chart account, they will be released to your MyChart.  If you prefer to have these labs released to you via telephone, please let us know.  If you had a pap smear or biopsy performed, expect to be contacted in about 7-10 days.  Controlled Substance Guidelines:  1. You cannot get an early refill, even it is lost.  2. You cannot get controlled medications from any other doctor, unless it is the emergency department and related to a new problem or injury.  3. You cannot use alcohol, marijuana, cocaine or any other recreational drugs while using this medication. This is very dangerous.  4. You are willing to have your urine drug tested at each visit.  5. You will not drive while using this medication, because that can put yourself and others in serious danger of an accident. 6. If any medication is stolen, then there must be a police report to verify it, or it cannot be refilled.  7. I will not prescribe these medications for longer than 3 months.  8. You must bring your pill bottle to each visit.  9. You must use the same pharmacy for all refills for the medication, unless you clear it with me beforehand.  10. You cannot share or sell this medication.    

## 2019-10-27 NOTE — Progress Notes (Signed)
Subjective: CC: Type 1 DM, GAD PCP: Janora Norlander, DO HOZ:[id number] Gary Stone is a 44 y.o. male presenting to clinic today for:  1. Type 1 DM w/ HLD Diagnosed in his 49s.  He has been hospitalized for DKA a few times in his lifetime.  No hospitalizations for DM1 in >10 years.  Patient reports: Glucometer: One Touch ultra, patient reports blood sugars have been running anywhere between 90s and 140s.  140s tend to be around his highs.  He does admit to eating more sugar recently and has subsequently been using more sliding scale.  Taking medication(s): Insulin glargine 25 units twice daily, sliding scale Humalog.  He has noted some weight gain despite eating only 2 meals per day and seem physically active at work.  He wonders if this is something that is induced by his opioid medications.  Last eye exam: 1.5 years ago, See Dr Marin Comment (still hasn't scheduled a visit) Last foot exam: Up-to-date Last A1c:  Lab Results  Component Value Date   HGBA1C 7.1 (H) 03/31/2019   Nephropathy screen indicated?: Due Last flu, zoster and/or pneumovax:  Immunization History  Administered Date(s) Administered  . DTaP 07/20/2016  . Pneumococcal Polysaccharide-23 11/03/2017  . Td 07/20/2016    ROS: denies dizziness, polyuria, polydipsia, unintended weight loss/gain, foot ulcerations, numbness or tingling in extremities or chest pain.  No visual disturbance.  2. Anxiety disorder History: Longstanding history of anxiety disorder.  Chronic Xanax for over 20 years.  Has titrated down from 4/d to 2-3/d.  Treated with various SSRIs and Wellbutrin throughout the years, none of which have controlled his symptoms.  Also treated by pain management at [facility].  Patient reports stability of symptoms.  He denies any excessive daytime sedation, falls, mental status changes including memory loss.  No respiratory depression.  He has occasional constipation but is often able to manage this.  He continues to see pain  clinic at [place] to use the alprazolam often only 2 times daily but occasionally a third dose as needed.  ROS: Per HPI  No Known Allergies Past Medical History:  Diagnosis Date  . Anxiety   . Diabetes mellitus without complication (HCC)     Current Outpatient Medications:  .  tiZANidine (ZANAFLEX) 4 MG tablet, TAKE 0.5-1 TABLETS BY MOUTH EVERY 6 HOURS AS NEEDED FOR MUSCLE SPASMS., Disp: 30 tablet, Rfl: 0 .  ALPRAZolam (XANAX) 1 MG tablet, 1 tab 2-3 times a day as needed for anxiety, Disp: 80 tablet, Rfl: 1 .  B-D INS SYRINGE 0.5CC/30GX1/2" 30G X 1/2" 0.5 ML MISC, 1 EACH BY DOES NOT APPLY ROUTE 2 (TWO) TIMES DAILY., Disp: , Rfl: 2 .  blood glucose meter kit and supplies KIT, Dispense based on patient and insurance preference. Use up to four times daily as directed. (FOR ICD-9 250.00, 250.01)., Disp: 1 each, Rfl: 0 .  gabapentin (NEURONTIN) 600 MG tablet, Take 600 mg by mouth 3 (three) times daily., Disp: , Rfl: 3 .  glucose blood (ONETOUCH ULTRA) test strip, Test BS 5 times daily Dx E10.9, Disp: 500 strip, Rfl: 3 .  HYDROcodone-acetaminophen (NORCO) 10-325 MG tablet, Take 1 tablet by mouth every 8 (eight) hours as needed., Disp: , Rfl:  .  Insulin Glargine (BASAGLAR KWIKPEN) 100 UNIT/ML SOPN, Inject 0.25 mLs (25 Units total) into the skin 2 (two) times daily. (Needs to be seen before next refill), Disp: 15 mL, Rfl: 0 .  insulin lispro (HUMALOG) 100 UNIT/ML injection, INJECT 5-20 UNITS BEFORE  MEALS PER SLIDING SCALE, Disp: 30 mL, Rfl: 0 .  Insulin Pen Needle 32G X 4 MM MISC, Use with insulin pen daily, Disp: 100 each, Rfl: 3 .  Insulin Syringe-Needle U-100 (B-D INS SYRINGE 0.5CC/30GX1/2") 30G X 1/2" 0.5 ML MISC, 1 each by Does not apply route 2 (two) times daily., Disp: 100 each, Rfl: 2 .  morphine (MS CONTIN) 15 MG 12 hr tablet, Take 15 mg by mouth every 12 (twelve) hours., Disp: , Rfl:  .  NARCAN 4 MG/0.1ML LIQD nasal spray kit, 1 (ONE) SPRAY AS NEEDED, Disp: , Rfl: 3 .   pravastatin (PRAVACHOL) 40 MG tablet, Take 1 tablet (40 mg total) by mouth daily., Disp: 90 tablet, Rfl: 0 Social History   Socioeconomic History  . Marital status: Married    Spouse name: Not on file  . Number of children: Not on file  . Years of education: Not on file  . Highest education level: Not on file  Occupational History  . Not on file  Tobacco Use  . Smoking status: Current Every Day Smoker    Packs/day: 0.25    Types: Cigarettes  . Smokeless tobacco: Never Used  Substance and Sexual Activity  . Alcohol use: No  . Drug use: No  . Sexual activity: Yes  Other Topics Concern  . Not on file  Social History Narrative  . Not on file   Social Determinants of Health   Financial Resource Strain:   . Difficulty of Paying Living Expenses:   Food Insecurity:   . Worried About Charity fundraiser in the Last Year:   . Arboriculturist in the Last Year:   Transportation Needs:   . Film/video editor (Medical):   Marland Kitchen Lack of Transportation (Non-Medical):   Physical Activity:   . Days of Exercise per Week:   . Minutes of Exercise per Session:   Stress:   . Feeling of Stress :   Social Connections:   . Frequency of Communication with Friends and Family:   . Frequency of Social Gatherings with Friends and Family:   . Attends Religious Services:   . Active Member of Clubs or Organizations:   . Attends Archivist Meetings:   Marland Kitchen Marital Status:   Intimate Partner Violence:   . Fear of Current or Ex-Partner:   . Emotionally Abused:   Marland Kitchen Physically Abused:   . Sexually Abused:    Family History  Problem Relation Age of Onset  . Healthy Mother   . Healthy Father     Objective: Office vital signs reviewed. BP 113/72   Pulse 87   Temp 98.6 F (37 C) (Temporal)   Ht 5' 11"  (1.803 m)   Wt 257 lb (116.6 kg)   SpO2 98%   BMI 35.84 kg/m   Physical Examination:  General: Awake, alert, well nourished, No acute distress HEENT: Normal. Sclera white,  MMM Cardio: regular rate and rhythm, S1S2 heard, no murmurs appreciated Pulm: Clear to auscultation bilaterally.  Normal work of breathing on room air. Extremities: warm, well perfused, No edema, cyanosis or clubbing; +2 pulses bilaterally Psych: Mood stable, speech normal, affect appropriate, good eye contact.  Depression screen [facility] 2/9 08/03/2019 03/31/2019 10/26/2018 07/27/2018 04/22/2018  Decreased Interest 0 0 0 0 0  Down, Depressed, Hopeless 0 0 0 0 0  PHQ - 2 Score 0 0 0 0 0  Altered sleeping - 0 0 - -  Tired, decreased energy - 0 0 - -  Change in appetite - 0 0 - -  Feeling bad or failure about yourself  - 0 0 - -  Trouble concentrating - 0 0 - -  Moving slowly or fidgety/restless - 0 0 - -  Suicidal thoughts - 0 0 - -  PHQ-9 Score - 0 0 - -   GAD 7 : Generalized Anxiety Score 03/31/2019  Nervous, Anxious, on Edge 1  Control/stop worrying 1  Worry too much - different things 0  Trouble relaxing 0  Restless 0  Easily annoyed or irritable 1  Afraid - awful might happen 0  Total GAD 7 Score 3  Anxiety Difficulty Not difficult at all     Assessment/ Plan: 44 y.o. male   1. Uncontrolled type 1 diabetes mellitus with hyperglycemia ([place]) Basaglar up to 27 units.  May gradually increase back to 30 units ending fasting blood sugars.  Avoid hypoglycemia.  We discussed that the more unhealthy his diet is the more he will have to "chase sugars - Bayer DCA Hb A1c Waived - Microalbumin / creatinine urine ratio - CMP14+EGFR - Insulin Glargine (BASAGLAR KWIKPEN) 100 UNIT/ML; Inject 0.25-0.3 mLs (25-30 Units total) into the skin 2 (two) times daily. Please ignore previous rx  Dispense: 15 mL; Refill: 12  2. Hyperlipidemia due to type 1 diabetes mellitus (HCC) Check lipid panel - pravastatin (PRAVACHOL) 40 MG tablet; Take 1 tablet (40 mg total) by mouth daily.  Dispense: 90 tablet; Refill: 3 - Lipid Panel - CMP14+EGFR  3. Generalized anxiety disorder Stable.  The national chronic  database was reviewed.  Recent refill.  Future refills ordered to place on file at pharmacy.  He will follow-up in 3 months, sooner if needed.  He is up-to-date on UDS and controlled substance contract - ALPRAZolam (XANAX) 1 MG tablet; 1 tab 2-3 times a day as needed for anxiety  Dispense: 80 tablet; Refill: 3  4. Chronic prescription benzodiazepine use - ALPRAZolam (XANAX) 1 MG tablet; 1 tab 2-3 times a day as needed for anxiety  Dispense: 80 tablet; Refill: 3  5. High risk medication use High risk medication use with concomitant use of opioids prescribed by a pain clinic.  At this time he is not demonstrating any concerning symptoms or signs but he is aware of red flag signs and symptoms.   Orders Placed This Encounter  Procedures  . Bayer DCA Hb A1c Waived  . Microalbumin / creatinine urine ratio  . Lipid Panel  . CMP14+EGFR   Meds ordered this encounter  Medications  . ALPRAZolam (XANAX) 1 MG tablet    Sig: 1 tab 2-3 times a day as needed for anxiety    Dispense:  80 tablet    Refill:  3  . DISCONTD: Insulin Glargine (BASAGLAR KWIKPEN) 100 UNIT/ML    Sig: Inject 0.25 mLs (25 Units total) into the skin 2 (two) times daily.    Dispense:  15 mL    Refill:  12  . pravastatin (PRAVACHOL) 40 MG tablet    Sig: Take 1 tablet (40 mg total) by mouth daily.    Dispense:  90 tablet    Refill:  3  . Insulin Glargine (BASAGLAR KWIKPEN) 100 UNIT/ML    Sig: Inject 0.25-0.3 mLs (25-30 Units total) into the skin 2 (two) times daily. Please ignore previous rx    Dispense:  15 mL    Refill:  [place] [phone]

## 2019-10-28 LAB — CMP14+EGFR
ALT: 15 IU/L (ref 0–44)
AST: 17 IU/L (ref 0–40)
Albumin/Globulin Ratio: 1.8 (ref 1.2–2.2)
Albumin: 4.3 g/dL (ref 4.0–5.0)
Alkaline Phosphatase: 67 IU/L (ref 39–117)
BUN/Creatinine Ratio: 12 (ref 9–20)
BUN: 15 mg/dL (ref 6–24)
Bilirubin Total: 0.4 mg/dL (ref 0.0–1.2)
CO2: 25 mmol/L (ref 20–29)
Calcium: 9.5 mg/dL (ref 8.7–10.2)
Chloride: 102 mmol/L (ref 96–106)
Creatinine, Ser: 1.22 mg/dL (ref 0.76–1.27)
GFR calc Af Amer: 83 mL/min/{1.73_m2} (ref >59)
GFR calc non Af Amer: 72 mL/min/{1.73_m2} (ref >59)
Globulin, Total: 2.4 g/dL (ref 1.5–4.5)
Glucose: 232 mg/dL — ABNORMAL HIGH (ref 65–99)
Potassium: 4 mmol/L (ref 3.5–5.2)
Sodium: 142 mmol/L (ref 134–144)
Total Protein: 6.7 g/dL (ref 6.0–8.5)

## 2019-10-28 LAB — LIPID PANEL
Chol/HDL Ratio: 3.5 ratio (ref 0.0–5.0)
Cholesterol, Total: 138 mg/dL (ref 100–199)
HDL: 39 mg/dL — ABNORMAL LOW (ref >39)
LDL Chol Calc (NIH): 68 mg/dL (ref 0–99)
Triglycerides: 181 mg/dL — ABNORMAL HIGH (ref 0–149)
VLDL Cholesterol Cal: 31 mg/dL (ref 5–40)

## 2019-10-28 LAB — MICROALBUMIN / CREATININE URINE RATIO
Creatinine, Urine: 262.3 mg/dL
Microalb/Creat Ratio: 3 mg/g creat (ref 0–29)
Microalbumin, Urine: 8 ug/mL

## 2019-11-19 ENCOUNTER — Other Ambulatory Visit: Payer: Self-pay | Admitting: *Deleted

## 2019-11-19 DIAGNOSIS — E1065 Type 1 diabetes mellitus with hyperglycemia: Secondary | ICD-10-CM

## 2019-11-19 MED ORDER — BASAGLAR KWIKPEN 100 UNIT/ML ~~LOC~~ SOPN: 25.0000 [IU] | PEN_INJECTOR | Freq: Two times a day (BID) | SUBCUTANEOUS | 0 refills | Status: DC

## 2019-11-24 ENCOUNTER — Other Ambulatory Visit: Payer: Self-pay | Admitting: Family Medicine

## 2019-11-24 DIAGNOSIS — G8929 Other chronic pain: Secondary | ICD-10-CM

## 2019-11-24 DIAGNOSIS — M25511 Pain in right shoulder: Secondary | ICD-10-CM

## 2020-01-23 ENCOUNTER — Other Ambulatory Visit: Payer: Self-pay | Admitting: Family Medicine

## 2020-01-28 ENCOUNTER — Encounter: Payer: Self-pay | Admitting: Family Medicine

## 2020-01-28 ENCOUNTER — Ambulatory Visit: Payer: 59 | Admitting: Family Medicine

## 2020-01-28 NOTE — Progress Notes (Deleted)
Subjective: CC: Type 1 DM, GAD PCP: Janora Norlander, DO QHU:Gary Stone is a 44 y.o. male presenting to clinic today for:  1. Type 1 DM w/ HLD Diagnosed in his 33s.  He has been hospitalized for DKA a few times in his lifetime.  No hospitalizations for DM1 in >10 years.  Patient reports: Glucometer: One Touch ultra, patient reports blood sugars have been running anywhere between 90s and 140s.  140s tend to be around his highs.  He does admit to eating more sugar recently and has subsequently been using more sliding scale.  Taking medication(s): Insulin glargine 25 units twice daily, sliding scale Humalog.  He has noted some weight gain despite eating only 2 meals per day and seem physically active at work.  He wonders if this is something that is induced by his opioid medications.  Last eye exam: 1.5 years ago, See Dr Marin Comment (still hasn't scheduled a visit) Last foot exam: Up-to-date Last A1c:  Lab Results  Component Value Date   HGBA1C 7.9 (H) 10/27/2019   Nephropathy screen indicated?: Due Last flu, zoster and/or pneumovax:  Immunization History  Administered Date(s) Administered  . DTaP 07/20/2016  . Pneumococcal Polysaccharide-23 11/03/2017  . Td 07/20/2016    ROS: denies dizziness, polyuria, polydipsia, unintended weight loss/gain, foot ulcerations, numbness or tingling in extremities or chest pain.  No visual disturbance.  2. Anxiety disorder History: Longstanding history of anxiety disorder.  Chronic Xanax for over 20 years.  Has titrated down from 4/d to 2-3/d.  Treated with various SSRIs and Wellbutrin throughout the years, none of which have controlled his symptoms.  Also treated by pain management at [facility].  Patient reports stability of symptoms.  He denies any excessive daytime sedation, falls, mental status changes including memory loss.  No respiratory depression.  He has occasional constipation but is often able to manage this.  He continues to see pain  clinic at [place] to use the alprazolam often only 2 times daily but occasionally a third dose as needed.  ROS: Per HPI  No Known Allergies Past Medical History:  Diagnosis Date  . Anxiety   . Diabetes mellitus without complication ([place])     Current Outpatient Medications:  .  ALPRAZolam (XANAX) 1 MG tablet, 1 tab 2-3 times a day as needed for anxiety, Disp: 80 tablet, Rfl: 3 .  B-D INS SYRINGE 0.5CC/30GX1/2" 30G X 1/2" 0.5 ML MISC, 1 EACH BY DOES NOT APPLY ROUTE 2 (TWO) TIMES DAILY., Disp: , Rfl: 2 .  blood glucose meter kit and supplies KIT, Dispense based on patient and insurance preference. Use up to four times daily as directed. (FOR ICD-9 250.00, 250.01)., Disp: 1 each, Rfl: 0 .  gabapentin (NEURONTIN) 600 MG tablet, Take 600 mg by mouth 3 (three) times daily., Disp: , Rfl: 3 .  glucose blood (ONETOUCH ULTRA) test strip, Test BS 5 times daily Dx E10.9, Disp: 500 strip, Rfl: 3 .  HYDROcodone-acetaminophen (NORCO) 10-325 MG tablet, Take 1 tablet by mouth every 8 (eight) hours as needed., Disp: , Rfl:  .  Insulin Glargine (BASAGLAR KWIKPEN) 100 UNIT/ML, Inject 0.25-0.3 mLs (25-30 Units total) into the skin 2 (two) times daily., Disp: 30 mL, Rfl: 0 .  insulin lispro (HUMALOG) 100 UNIT/ML injection, INJECT 5-20 UNITS BEFORE MEALS PER SLIDING SCALE, Disp: 30 mL, Rfl: 0 .  Insulin Syringe-Needle U-100 (B-D INS SYRINGE 0.5CC/30GX1/2") 30G X 1/2" 0.5 ML MISC, 1 each by Does not apply route 2 (two)  times daily., Disp: 100 each, Rfl: 2 .  morphine (MS CONTIN) 15 MG 12 hr tablet, Take 15 mg by mouth every 12 (twelve) hours., Disp: , Rfl:  .  NARCAN 4 MG/0.1ML LIQD nasal spray kit, 1 (ONE) SPRAY AS NEEDED, Disp: , Rfl: 3 .  pravastatin (PRAVACHOL) 40 MG tablet, Take 1 tablet (40 mg total) by mouth daily., Disp: 90 tablet, Rfl: 3 .  tiZANidine (ZANAFLEX) 4 MG tablet, TAKE 1/2-1 TABLET BY MOUTH EVERY 6 HOURS AS NEEDED FOR MUSCLE SPASMS., Disp: 30 tablet, Rfl: 3 .  ULTICARE MICRO  PEN NEEDLES 32G X 4 MM MISC, USE WITH INSULIN PEN DAILY, Disp: 100 each, Rfl: 6 Social History   Socioeconomic History  . Marital status: Married    Spouse name: Not on file  . Number of children: Not on file  . Years of education: Not on file  . Highest education level: Not on file  Occupational History  . Not on file  Tobacco Use  . Smoking status: Current Every Day Smoker    Packs/day: 0.25    Types: Cigarettes  . Smokeless tobacco: Never Used  Vaping Use  . Vaping Use: Never used  Substance and Sexual Activity  . Alcohol use: No  . Drug use: No  . Sexual activity: Yes  Other Topics Concern  . Not on file  Social History Narrative  . Not on file   Social Determinants of Health   Financial Resource Strain:   . Difficulty of Paying Living Expenses:   Food Insecurity:   . Worried About Charity fundraiser in the Last Year:   . Arboriculturist in the Last Year:   Transportation Needs:   . Film/video editor (Medical):   Marland Kitchen Lack of Transportation (Non-Medical):   Physical Activity:   . Days of Exercise per Week:   . Minutes of Exercise per Session:   Stress:   . Feeling of Stress :   Social Connections:   . Frequency of Communication with Friends and Family:   . Frequency of Social Gatherings with Friends and Family:   . Attends Religious Services:   . Active Member of Clubs or Organizations:   . Attends Archivist Meetings:   Marland Kitchen Marital Status:   Intimate Partner Violence:   . Fear of Current or Ex-Partner:   . Emotionally Abused:   Marland Kitchen Physically Abused:   . Sexually Abused:    Family History  Problem Relation Age of Onset  . Healthy Mother   . Healthy Father     Objective: Office vital signs reviewed. There were no vitals taken for this visit.  Physical Examination:  General: Awake, alert, well nourished, No acute distress HEENT: Normal. Sclera white, MMM Cardio: regular rate and rhythm, S1S2 heard, no murmurs appreciated Pulm: Clear to  auscultation bilaterally.  Normal work of breathing on room air. Extremities: warm, well perfused, No edema, cyanosis or clubbing; +2 pulses bilaterally Psych: Mood stable, speech normal, affect appropriate, good eye contact.  Depression screen [facility] 2/9 10/27/2019 08/03/2019 03/31/2019 10/26/2018 07/27/2018  Decreased Interest 0 0 0 0 0  Down, Depressed, Hopeless 0 0 0 0 0  PHQ - 2 Score 0 0 0 0 0  Altered sleeping 0 - 0 0 -  Tired, decreased energy 0 - 0 0 -  Change in appetite 0 - 0 0 -  Feeling bad or failure about yourself  0 - 0 0 -  Trouble concentrating 0 - 0 0 -  Moving slowly or fidgety/restless 0 - 0 0 -  Suicidal thoughts 0 - 0 0 -  PHQ-9 Score 0 - 0 0 -   GAD 7 : Generalized Anxiety Score 03/31/2019  Nervous, Anxious, on Edge 1  Control/stop worrying 1  Worry too much - different things 0  Trouble relaxing 0  Restless 0  Easily annoyed or irritable 1  Afraid - awful might happen 0  Total GAD 7 Score 3  Anxiety Difficulty Not difficult at all     Assessment/ Plan: 44 y.o. male   ***   No orders of the defined types were placed in this encounter.  No orders of the defined types were placed in this encounter.    Janora Norlander, DO [place] [phone]

## 2020-02-13 ENCOUNTER — Other Ambulatory Visit: Payer: Self-pay | Admitting: Family Medicine

## 2020-02-13 DIAGNOSIS — G8929 Other chronic pain: Secondary | ICD-10-CM

## 2020-03-28 ENCOUNTER — Other Ambulatory Visit: Payer: Self-pay | Admitting: Family Medicine

## 2020-03-28 DIAGNOSIS — Z79899 Other long term (current) drug therapy: Secondary | ICD-10-CM

## 2020-03-28 DIAGNOSIS — F411 Generalized anxiety disorder: Secondary | ICD-10-CM

## 2020-03-31 ENCOUNTER — Telehealth: Payer: Self-pay | Admitting: Family Medicine

## 2020-03-31 ENCOUNTER — Encounter: Payer: Self-pay | Admitting: Family Medicine

## 2020-03-31 NOTE — Telephone Encounter (Signed)
appt made for 04/04/20. Pt aware

## 2020-03-31 NOTE — Telephone Encounter (Signed)
Pt must be seen for refill, LMTCB

## 2020-03-31 NOTE — Telephone Encounter (Signed)
Pt scheduled for 10/25, first available and wanting to see if enough anxiety meds can be sent in to last until that appt.

## 2020-04-04 ENCOUNTER — Other Ambulatory Visit: Payer: Self-pay

## 2020-04-04 ENCOUNTER — Ambulatory Visit (INDEPENDENT_AMBULATORY_CARE_PROVIDER_SITE_OTHER): Payer: Self-pay | Admitting: Family Medicine

## 2020-04-04 ENCOUNTER — Encounter: Payer: Self-pay | Admitting: Family Medicine

## 2020-04-04 VITALS — BP 125/78 | HR 87 | Temp 97.9°F | Ht 71.0 in | Wt 254.0 lb

## 2020-04-04 DIAGNOSIS — E785 Hyperlipidemia, unspecified: Secondary | ICD-10-CM

## 2020-04-04 DIAGNOSIS — E1069 Type 1 diabetes mellitus with other specified complication: Secondary | ICD-10-CM

## 2020-04-04 DIAGNOSIS — Z79899 Other long term (current) drug therapy: Secondary | ICD-10-CM

## 2020-04-04 DIAGNOSIS — F411 Generalized anxiety disorder: Secondary | ICD-10-CM

## 2020-04-04 DIAGNOSIS — E1065 Type 1 diabetes mellitus with hyperglycemia: Secondary | ICD-10-CM

## 2020-04-04 LAB — BAYER DCA HB A1C WAIVED: HB A1C (BAYER DCA - WAIVED): 7.5 % — ABNORMAL HIGH (ref <7.0)

## 2020-04-04 MED ORDER — ALPRAZOLAM 1 MG PO TABS: ORAL_TABLET | ORAL | 2 refills | Status: DC

## 2020-04-04 NOTE — Progress Notes (Signed)
Subjective: CC: Type 1 DM, GAD PCP: Janora Norlander, DO GYK:ZLDJTTS Gary Stone is a 44 y.o. male presenting to clinic today for:  1. Type 1 DM w/ HLD Diagnosed in his 50s.  He has been hospitalized for DKA a few times in his lifetime.  No hospitalizations for DM1 in >10 years.  He has been trying to stretch out his insulin because he has been out of insurance with his new job.  It spaces start mid month.  He just renewed his insulins and paid out of pocket for these.  Does not report any polydipsia, polyuria or blurred vision  Last eye exam: Sees Dr Marin Comment   Last foot exam: Needs Last A1c:  Lab Results  Component Value Date   HGBA1C 7.9 (H) 10/27/2019   Nephropathy screen indicated?: DUTDue Last flu, zoster and/or pneumovax:  Immunization History  Administered Date(s) Administered   DTaP 07/20/2016   Pneumococcal Polysaccharide-23 11/03/2017   Td 07/20/2016   2. Anxiety disorder History: Longstanding history of anxiety disorder.  Chronic Xanax for over 20 years.  Has titrated down from 4/d to 2-3/d.  Treated with various SSRIs and Wellbutrin throughout the years, none of which have controlled his symptoms.  Also treated by pain management at [facility].  Symptoms have been stable.  He is very close to running totally out of his alprazolam.  He had some on reserve because he typically only takes this twice daily.  He continues to take pain medication as prescribed by his pain doctor.  He does not report any respiratory depression, blurred vision, falls or confusion.  He is doing very well in his new job.  ROS: Per HPI  No Known Allergies Past Medical History:  Diagnosis Date   Anxiety    Diabetes mellitus without complication ([place])     Current Outpatient Medications:    ALPRAZolam (XANAX) 1 MG tablet, 1 tab 2-3 times a day as needed for anxiety, Disp: 80 tablet, Rfl: 3   B-D INS SYRINGE 0.5CC/30GX1/2" 30G X 1/2" 0.5 ML MISC, 1 EACH BY DOES NOT APPLY ROUTE 2 (TWO)  TIMES DAILY., Disp: , Rfl: 2   blood glucose meter kit and supplies KIT, Dispense based on patient and insurance preference. Use up to four times daily as directed. (FOR ICD-9 250.00, 250.01)., Disp: 1 each, Rfl: 0   gabapentin (NEURONTIN) 600 MG tablet, Take 600 mg by mouth 3 (three) times daily., Disp: , Rfl: 3   glucose blood (ONETOUCH ULTRA) test strip, Test BS 5 times daily Dx E10.9, Disp: 500 strip, Rfl: 3   HYDROcodone-acetaminophen (NORCO) 10-325 MG tablet, Take 1 tablet by mouth every 8 (eight) hours as needed., Disp: , Rfl:    Insulin Glargine (BASAGLAR KWIKPEN) 100 UNIT/ML, Inject 0.25-0.3 mLs (25-30 Units total) into the skin 2 (two) times daily., Disp: 30 mL, Rfl: 0   insulin lispro (HUMALOG) 100 UNIT/ML injection, INJECT 5-20 UNITS BEFORE MEALS PER SLIDING SCALE, Disp: 30 mL, Rfl: 0   Insulin Syringe-Needle U-100 (B-D INS SYRINGE 0.5CC/30GX1/2") 30G X 1/2" 0.5 ML MISC, 1 each by Does not apply route 2 (two) times daily., Disp: 100 each, Rfl: 2   morphine (MS CONTIN) 15 MG 12 hr tablet, Take 15 mg by mouth every 12 (twelve) hours., Disp: , Rfl:    NARCAN 4 MG/0.1ML LIQD nasal spray kit, 1 (ONE) SPRAY AS NEEDED, Disp: , Rfl: 3   pravastatin (PRAVACHOL) 40 MG tablet, Take 1 tablet (40 mg total) by mouth daily., Disp: 90 tablet,  Rfl: 3   tiZANidine (ZANAFLEX) 4 MG tablet, TAKE 1/2-1 TABLET BY MOUTH EVERY 6 HOURS AS NEEDED FOR MUSCLE SPASMS., Disp: 30 tablet, Rfl: 3   ULTICARE MICRO PEN NEEDLES 32G X 4 MM MISC, USE WITH INSULIN PEN DAILY, Disp: 100 each, Rfl: 6 Social History   Socioeconomic History   Marital status: Married    Spouse name: Not on file   Number of children: Not on file   Years of education: Not on file   Highest education level: Not on file  Occupational History   Not on file  Tobacco Use   Smoking status: Current Every Day Smoker    Packs/day: 0.25    Types: Cigarettes   Smokeless tobacco: Never Used  Vaping Use   Vaping Use: Never used    Substance and Sexual Activity   Alcohol use: No   Drug use: No   Sexual activity: Yes  Other Topics Concern   Not on file  Social History Narrative   Not on file   Social Determinants of Health   Financial Resource Strain:    Difficulty of Paying Living Expenses: Not on file  Food Insecurity:    Worried About Running Out of Food in the Last Year: Not on file   Ran Out of Food in the Last Year: Not on file  Transportation Needs:    Lack of Transportation (Medical): Not on file   Lack of Transportation (Non-Medical): Not on file  Physical Activity:    Days of Exercise per Week: Not on file   Minutes of Exercise per Session: Not on file  Stress:    Feeling of Stress : Not on file  Social Connections:    Frequency of Communication with Friends and Family: Not on file   Frequency of Social Gatherings with Friends and Family: Not on file   Attends Religious Services: Not on file   Active Member of Clubs or Organizations: Not on file   Attends Archivist Meetings: Not on file   Marital Status: Not on file  Intimate Partner Violence:    Fear of Current or Ex-Partner: Not on file   Emotionally Abused: Not on file   Physically Abused: Not on file   Sexually Abused: Not on file   Family History  Problem Relation Age of Onset   Healthy Mother    Healthy Father     Objective: Office vital signs reviewed. BP 125/78    Pulse 87    Temp 97.9 F (36.6 C) (Temporal)    Ht 5' 11"  (1.803 m)    Wt 254 lb (115.2 kg)    SpO2 97%    BMI 35.43 kg/m   Physical Examination:  General: Awake, alert, well nourished, No acute distress HEENT: Normal. Sclera white, MMM Cardio: regular rate and rhythm, S1S2 heard, no murmurs appreciated Pulm: Clear to auscultation bilaterally.  Normal work of breathing on room air. Extremities: warm, well perfused, No edema, cyanosis or clubbing; +2 pulses bilaterally Psych: Mood stable, speech normal, affect appropriate,  good eye contact. Thought process linear.  Depression screen [facility] 2/9 10/27/2019 08/03/2019 03/31/2019 10/26/2018 07/27/2018  Decreased Interest 0 0 0 0 0  Down, Depressed, Hopeless 0 0 0 0 0  PHQ - 2 Score 0 0 0 0 0  Altered sleeping 0 - 0 0 -  Tired, decreased energy 0 - 0 0 -  Change in appetite 0 - 0 0 -  Feeling bad or failure about yourself  0 - 0  0 -  Trouble concentrating 0 - 0 0 -  Moving slowly or fidgety/restless 0 - 0 0 -  Suicidal thoughts 0 - 0 0 -  PHQ-9 Score 0 - 0 0 -   GAD 7 : Generalized Anxiety Score 03/31/2019  Nervous, Anxious, on Edge 1  Control/stop worrying 1  Worry too much - different things 0  Trouble relaxing 0  Restless 0  Easily annoyed or irritable 1  Afraid - awful might happen 0  Total GAD 7 Score 3  Anxiety Difficulty Not difficult at all     Assessment/ Plan: 44 y.o. male   1. Uncontrolled type 1 diabetes mellitus with hyperglycemia (HCC) A1c down to 7.5.  Samples of insulin provided.  Only had concentrated Humalog in stock. Informed of reduced dose. - Bayer DCA Hb A1c Waived  2. Hyperlipidemia due to type 1 diabetes mellitus (HCC) Continue statin  3. Generalized anxiety disorder Stable. UDS/ CSC next visit.  Held off today due to cost/ lack on insurance.  No concerns for diversion/ misuse  - ALPRAZolam (XANAX) 1 MG tablet; 1 tab 2-3 times a day as needed for anxiety  Dispense: 80 tablet; Refill: 2  4. Chronic prescription benzodiazepine use - ALPRAZolam (XANAX) 1 MG tablet; 1 tab 2-3 times a day as needed for anxiety  Dispense: 80 tablet; Refill: 2  5. High risk medication use  Orders Placed This Encounter  Procedures   Bayer [place] Hb A1c Waived   Meds ordered this encounter  Medications   ALPRAZolam (XANAX) 1 MG tablet    Sig: 1 tab 2-3 times a day as needed for anxiety    Dispense:  80 tablet    Refill:  Knot[place], DO [place] [phone]

## 2020-04-25 ENCOUNTER — Other Ambulatory Visit: Payer: Self-pay | Admitting: Family Medicine

## 2020-04-25 DIAGNOSIS — G8929 Other chronic pain: Secondary | ICD-10-CM

## 2020-04-26 ENCOUNTER — Telehealth: Payer: Self-pay

## 2020-04-26 MED ORDER — TRESIBA FLEXTOUCH 200 UNIT/ML ~~LOC~~ SOPN: 30.0000 [IU] | PEN_INJECTOR | Freq: Two times a day (BID) | SUBCUTANEOUS | 3 refills | Status: DC

## 2020-04-26 NOTE — Telephone Encounter (Signed)
Please inquire what the formulary alternative is.

## 2020-04-26 NOTE — Telephone Encounter (Signed)
   Call placed to pharmacy  Insurance prefers Lantus or Brynda Peon to call patient to see if he had a preference; didn't answer; no VM box is set up  Lantus is most similar to Basaglar (both are insulin glargine), but [name] is a great option as well.  Either would be appropriate.

## 2020-04-26 NOTE — Telephone Encounter (Signed)
Patient okay with Gary Stone u-200 pen Reviewed dosing and copay card information Will route to PCP for [facility]

## 2020-04-26 NOTE — Telephone Encounter (Signed)
I'd like to put him on tresiba since it's a little nicer than Lantus.  Can we let him try as a sample and if he likes will you cosign to me, Raynelle Fanning?

## 2020-04-26 NOTE — Telephone Encounter (Signed)
Basaglar KwikPen 100UNIT/ML pen injectors are not covered by insurance.  Would you like to send in alternative or do PA? No alternatives given  Key- [id number]  Please advise and send to PA pool.

## 2020-04-26 NOTE — Addendum Note (Signed)
Addended by: Vanice Sarah D on: 04/26/2020 02:07 PM   Modules accepted: Orders

## 2020-05-22 ENCOUNTER — Ambulatory Visit: Payer: Self-pay | Admitting: Family Medicine

## 2020-06-20 ENCOUNTER — Other Ambulatory Visit: Payer: Self-pay | Admitting: Family Medicine

## 2020-06-20 DIAGNOSIS — M25511 Pain in right shoulder: Secondary | ICD-10-CM

## 2020-06-20 DIAGNOSIS — G8929 Other chronic pain: Secondary | ICD-10-CM

## 2020-07-05 ENCOUNTER — Ambulatory Visit: Payer: BC Managed Care – PPO | Admitting: Family Medicine

## 2020-07-05 ENCOUNTER — Encounter: Payer: Self-pay | Admitting: Family Medicine

## 2020-07-05 ENCOUNTER — Other Ambulatory Visit: Payer: Self-pay

## 2020-07-05 VITALS — BP 108/68 | HR 72 | Temp 98.3°F | Ht 71.0 in | Wt 242.0 lb

## 2020-07-05 DIAGNOSIS — Z79899 Other long term (current) drug therapy: Secondary | ICD-10-CM | POA: Diagnosis not present

## 2020-07-05 DIAGNOSIS — E1069 Type 1 diabetes mellitus with other specified complication: Secondary | ICD-10-CM | POA: Diagnosis not present

## 2020-07-05 DIAGNOSIS — E1065 Type 1 diabetes mellitus with hyperglycemia: Secondary | ICD-10-CM | POA: Diagnosis not present

## 2020-07-05 DIAGNOSIS — F411 Generalized anxiety disorder: Secondary | ICD-10-CM | POA: Diagnosis not present

## 2020-07-05 DIAGNOSIS — E785 Hyperlipidemia, unspecified: Secondary | ICD-10-CM

## 2020-07-05 LAB — BAYER DCA HB A1C WAIVED: HB A1C (BAYER DCA - WAIVED): 7.4 % — ABNORMAL HIGH (ref <7.0)

## 2020-07-05 MED ORDER — ALPRAZOLAM 1 MG PO TABS: ORAL_TABLET | ORAL | 2 refills | Status: DC

## 2020-07-05 NOTE — Progress Notes (Signed)
Subjective: CC: Follow-up diabetes PCP: Janora Norlander, DO Gary Stone is a 44 y.o. male presenting to clinic today for:  1.  Type 1 diabetes with hyperlipidemia Patient has been injecting about 26 units of Tresiba twice daily.  He continues to use humalog with meals.  Currently using about 15units/ day but is following a carb restricted diet newly.  Compliant with Pravastatin.  He denies visual disturbance, CP, SOB, dizziness, polyuria, polydipsia or sensory changes.  2. GAD Continues to require about 2 tablets of xanax per day.  Rare use of 3rd tablet.  No respiratory depression, excessive sedation, falls, visual or auditory hallucinations.  Never drinks alcohol.  He is on chronic opioids but has been stable with this combination for some time now  ROS: Per HPI  No Known Allergies Past Medical History:  Diagnosis Date  . Anxiety   . Diabetes mellitus without complication ([place])     Current Outpatient Medications:  .  ALPRAZolam (XANAX) 1 MG tablet, 1 tab 2-3 times a day as needed for anxiety, Disp: 80 tablet, Rfl: 2 .  B-D INS SYRINGE 0.5CC/30GX1/2" 30G X 1/2" 0.5 ML MISC, 1 EACH BY DOES NOT APPLY ROUTE 2 (TWO) TIMES DAILY., Disp: , Rfl: 2 .  blood glucose meter kit and supplies KIT, Dispense based on patient and insurance preference. Use up to four times daily as directed. (FOR ICD-9 250.00, 250.01)., Disp: 1 each, Rfl: 0 .  gabapentin (NEURONTIN) 600 MG tablet, Take 600 mg by mouth 3 (three) times daily., Disp: , Rfl: 3 .  glucose blood (ONETOUCH ULTRA) test strip, Test BS 5 times daily Dx E10.9, Disp: 500 strip, Rfl: 3 .  HYDROcodone-acetaminophen (NORCO) 10-325 MG tablet, Take 1 tablet by mouth every 8 (eight) hours as needed., Disp: , Rfl:  .  insulin degludec (TRESIBA FLEXTOUCH) 200 UNIT/ML FlexTouch Pen, Inject 30 Units into the skin in the morning and at bedtime., Disp: 9 mL, Rfl: 3 .  insulin lispro (HUMALOG) 100 UNIT/ML injection, INJECT 5-20 UNITS BEFORE MEALS  PER SLIDING SCALE, Disp: 30 mL, Rfl: 0 .  Insulin Syringe-Needle U-100 (B-D INS SYRINGE 0.5CC/30GX1/2") 30G X 1/2" 0.5 ML MISC, 1 each by Does not apply route 2 (two) times daily., Disp: 100 each, Rfl: 2 .  morphine (MS CONTIN) 15 MG 12 hr tablet, Take 15 mg by mouth every 12 (twelve) hours., Disp: , Rfl:  .  NARCAN 4 MG/0.1ML LIQD nasal spray kit, 1 (ONE) SPRAY AS NEEDED, Disp: , Rfl: 3 .  pravastatin (PRAVACHOL) 40 MG tablet, Take 1 tablet (40 mg total) by mouth daily., Disp: 90 tablet, Rfl: 3 .  tiZANidine (ZANAFLEX) 4 MG tablet, TAKE 1/2-1 TABLET BY MOUTH EVERY 6 HOURS AS NEEDED FOR MUSCLE SPASMS., Disp: 30 tablet, Rfl: 3 .  ULTICARE MICRO PEN NEEDLES 32G X 4 MM MISC, USE WITH INSULIN PEN DAILY, Disp: 100 each, Rfl: 6 Social History   Socioeconomic History  . Marital status: Married    Spouse name: Not on file  . Number of children: Not on file  . Years of education: Not on file  . Highest education level: Not on file  Occupational History  . Not on file  Tobacco Use  . Smoking status: Current Every Day Smoker    Packs/day: 0.25    Types: Cigarettes  . Smokeless tobacco: Never Used  Vaping Use  . Vaping Use: Never used  Substance and Sexual Activity  . Alcohol use: No  . Drug use: No  .  Sexual activity: Yes  Other Topics Concern  . Not on file  Social History Narrative  . Not on file   Social Determinants of Health   Financial Resource Strain:   . Difficulty of Paying Living Expenses: Not on file  Food Insecurity:   . Worried About Charity fundraiser in the Last Year: Not on file  . Ran Out of Food in the Last Year: Not on file  Transportation Needs:   . Lack of Transportation (Medical): Not on file  . Lack of Transportation (Non-Medical): Not on file  Physical Activity:   . Days of Exercise per Week: Not on file  . Minutes of Exercise per Session: Not on file  Stress:   . Feeling of Stress : Not on file  Social Connections:   . Frequency of Communication with  Friends and Family: Not on file  . Frequency of Social Gatherings with Friends and Family: Not on file  . Attends Religious Services: Not on file  . Active Member of Clubs or Organizations: Not on file  . Attends Archivist Meetings: Not on file  . Marital Status: Not on file  Intimate Partner Violence:   . Fear of Current or Ex-Partner: Not on file  . Emotionally Abused: Not on file  . Physically Abused: Not on file  . Sexually Abused: Not on file   Family History  Problem Relation Age of Onset  . Healthy Mother   . Healthy Father     Objective: Office vital signs reviewed. BP 108/68   Pulse 72   Temp 98.3 F (36.8 C)   Ht $R'5\' 11"'OY$  (1.803 m)   Wt 242 lb (109.8 kg)   SpO2 98%   BMI 33.75 kg/m   Physical Examination:  General: Awake, alert, well nourished, No acute distress HEENT: Normal, sclera white Cardio: regular rate and rhythm, S1S2 heard, no murmurs appreciated Pulm: clear to auscultation bilaterally, no wheezes, rhonchi or rales; normal work of breathing on room air Neuro: see DM foot  Diabetic Foot Exam - Simple   Simple Foot Form Diabetic Foot exam was performed with the following findings: Yes 07/05/2020  5:44 PM  Visual Inspection No deformities, no ulcerations, no other skin breakdown bilaterally: Yes Sensation Testing Intact to touch and monofilament testing bilaterally: Yes Pulse Check Posterior Tibialis and Dorsalis pulse intact bilaterally: Yes Comments     Assessment/ Plan: 44 y.o. male   Uncontrolled type 1 diabetes mellitus with hyperglycemia (HCC) - Plan: Bayer DCA Hb A1c Waived  Hyperlipidemia due to type 1 diabetes mellitus (HCC)  Generalized anxiety disorder - Plan: ALPRAZolam (XANAX) 1 MG tablet  Chronic prescription benzodiazepine use - Plan: ALPRAZolam (XANAX) 1 MG tablet  High risk medication use  Diabetes still technically uncontrolled but A1c has dropped slightly to 7.4.  I reinforced again appropriate sliding scale  and compliance with regimen.  We will see him back in about 3 months for recheck.  Foot exam was performed.  Needs eye exam.  Declined influenza vaccination.  Anxiety is stable.  Xanax renewed to be placed on file.  Continue sparing use.  No red flags though does have high risk medication combo with opioid. The Narcotic Database has been reviewed.  There were no red flags.     Orders Placed This Encounter  Procedures  . Bayer DCA Hb A1c Waived   No orders of the defined types were placed in this encounter.    Janora Norlander, DO [facility] (  [phone]

## 2020-07-17 ENCOUNTER — Other Ambulatory Visit: Payer: Self-pay | Admitting: Family Medicine

## 2020-07-17 DIAGNOSIS — E109 Type 1 diabetes mellitus without complications: Secondary | ICD-10-CM

## 2020-07-19 ENCOUNTER — Telehealth: Payer: Self-pay

## 2020-07-19 MED ORDER — NOVOLOG FLEXPEN 100 UNIT/ML ~~LOC~~ SOPN: PEN_INJECTOR | SUBCUTANEOUS | 11 refills | Status: DC

## 2020-07-19 NOTE — Telephone Encounter (Signed)
Pt aware in office

## 2020-07-19 NOTE — Telephone Encounter (Signed)
humalog pen sample given  Will switch patient to Novolog per insurance--he states humalog is not covered--same instructions

## 2020-08-12 ENCOUNTER — Other Ambulatory Visit: Payer: Self-pay | Admitting: Family Medicine

## 2020-08-12 DIAGNOSIS — G8929 Other chronic pain: Secondary | ICD-10-CM

## 2020-08-12 DIAGNOSIS — M25511 Pain in right shoulder: Secondary | ICD-10-CM

## 2020-08-13 ENCOUNTER — Other Ambulatory Visit: Payer: Self-pay | Admitting: Family Medicine

## 2020-08-14 ENCOUNTER — Other Ambulatory Visit: Payer: Self-pay | Admitting: Family Medicine

## 2020-08-14 NOTE — Telephone Encounter (Signed)
Pharmacy comment:  Alternative Requested:INSURANCE REQUESTS LANTUS OR TOUJEO.

## 2020-08-15 ENCOUNTER — Other Ambulatory Visit: Payer: Self-pay | Admitting: Family Medicine

## 2020-08-22 ENCOUNTER — Telehealth: Payer: Self-pay | Admitting: *Deleted

## 2020-08-22 MED ORDER — LANTUS SOLOSTAR 100 UNIT/ML ~~LOC~~ SOPN: 26.0000 [IU] | PEN_INJECTOR | Freq: Every day | SUBCUTANEOUS | 3 refills | Status: DC

## 2020-08-22 NOTE — Telephone Encounter (Signed)
Left message to call back  

## 2020-08-22 NOTE — Addendum Note (Signed)
Addended by: Raliegh Ip on: 08/22/2020 08:58 AM   Modules accepted: Orders

## 2020-08-22 NOTE — Telephone Encounter (Signed)
TC from [facility] is not covering [place] They cover Lantus or Toujeo Pt has taking Lantus in the past, please send new Rx for Lantus, He is out of insulin and needs today

## 2020-08-22 NOTE — Telephone Encounter (Signed)
Lntus sent

## 2020-08-23 ENCOUNTER — Other Ambulatory Visit: Payer: Self-pay | Admitting: Family Medicine

## 2020-08-23 NOTE — Telephone Encounter (Signed)
Can you please verify this? It was just switched to Novolog last month due to insurance   Danella Maiers, [facility]      07/19/20 4:03 PM Note humalog pen sample given   Will switch patient to Novolog per insurance--he states humalog is not covered--same instructions

## 2020-08-23 NOTE — Telephone Encounter (Signed)
Pharmacy comment:  Alternative Requested: THE PRESCRIBED MEDICATION IS NOT COVERED BY INSURANCE. PLEASE CONSIDER CHANGING TO ONE OF THE SUGGESTED COVERED ALTERNATIVES.   All Pharmacy Suggested Alternatives:   0 insulin lispro (HUMALOG) 100 UNIT/ML injection 0 insulin lispro (HUMALOG KWIKPEN) 100 UNIT/ML KwikPen

## 2020-09-04 ENCOUNTER — Other Ambulatory Visit: Payer: Self-pay | Admitting: Family Medicine

## 2020-09-04 DIAGNOSIS — M25511 Pain in right shoulder: Secondary | ICD-10-CM

## 2020-09-04 DIAGNOSIS — G8929 Other chronic pain: Secondary | ICD-10-CM

## 2020-09-15 ENCOUNTER — Other Ambulatory Visit: Payer: Self-pay | Admitting: Family Medicine

## 2020-09-15 MED ORDER — INSULIN LISPRO (1 UNIT DIAL) 100 UNIT/ML (KWIKPEN): 5.0000 [IU] | PEN_INJECTOR | Freq: Three times a day (TID) | SUBCUTANEOUS | 3 refills | Status: DC

## 2020-10-02 ENCOUNTER — Ambulatory Visit (INDEPENDENT_AMBULATORY_CARE_PROVIDER_SITE_OTHER): Payer: BC Managed Care – PPO | Admitting: Family Medicine

## 2020-10-02 ENCOUNTER — Other Ambulatory Visit: Payer: Self-pay

## 2020-10-02 VITALS — BP 121/68 | HR 100 | Temp 97.9°F | Ht 71.0 in

## 2020-10-02 DIAGNOSIS — E109 Type 1 diabetes mellitus without complications: Secondary | ICD-10-CM

## 2020-10-02 DIAGNOSIS — F411 Generalized anxiety disorder: Secondary | ICD-10-CM

## 2020-10-02 DIAGNOSIS — F1190 Opioid use, unspecified, uncomplicated: Secondary | ICD-10-CM

## 2020-10-02 DIAGNOSIS — M25571 Pain in right ankle and joints of right foot: Secondary | ICD-10-CM

## 2020-10-02 DIAGNOSIS — E1069 Type 1 diabetes mellitus with other specified complication: Secondary | ICD-10-CM | POA: Diagnosis not present

## 2020-10-02 DIAGNOSIS — Z79899 Other long term (current) drug therapy: Secondary | ICD-10-CM

## 2020-10-02 DIAGNOSIS — E785 Hyperlipidemia, unspecified: Secondary | ICD-10-CM

## 2020-10-02 LAB — BAYER DCA HB A1C WAIVED: HB A1C (BAYER DCA - WAIVED): 7.1 % — ABNORMAL HIGH (ref <7.0)

## 2020-10-02 MED ORDER — ALPRAZOLAM 1 MG PO TABS: ORAL_TABLET | ORAL | 2 refills | Status: DC

## 2020-10-02 NOTE — Patient Instructions (Signed)
Concern for possible tear of anterior talofibular ligament.  Referral to [facility] placed.  Please call and ask for Toni Amend if you hear nothing by tomorrow.

## 2020-10-02 NOTE — Progress Notes (Signed)
Subjective: CC: DM PCP: Janora Norlander, DO HER:DEYCXKG Gary Stone is a 45 y.o. male presenting to clinic today for:  1. Type 1 Diabetes with hyperlipidemia:  Patient reports compliance with his insulin.  He still working on try to get blood sugar down.  He admits to a couple of episodes of hypoglycemia into the 40s and 50s where he did not eat sufficient carbohydrates with his supper.  He does not report any loss of consciousness, chest pain or shortness of breath.  He is compliant with his Pravachol.  Last eye exam: Needs.  He knows that this needs to be scheduled Last foot exam: Up-to-date Last A1c:  Lab Results  Component Value Date   HGBA1C 7.4 (H) 07/05/2020   Nephropathy screen indicated?:  Needs Last flu, zoster and/or pneumovax:  Immunization History  Administered Date(s) Administered  . DTaP 07/20/2016  . Pneumococcal Polysaccharide-23 11/03/2017  . Td 07/20/2016    ROS: No chest pain, shortness of breath.  No visual disturbance.  Has not yet set up his eye exam but will do so soon  2. Anxiety disorder/ chronic benzo use Continues to use the alprazolam sparingly.  No reports of excessive daytime sedation, falls, respiratory depression.  He is continuously treated by pain clinic with opioids.  No apparent complications from combination of medicines at this time  3.  Foot injury Patient reports that he injured his right foot/ankle over the weekend.  He was seen at [facility] and had imaging there.  Per the report, there was no fracture.  He was told that he had an ankle sprain.  He describes the event in which she hurt his foot and notes that he had simply tried to smash an object with the foot but did not really roll his ankle.  He does describe swelling and ecchymosis that occurred about an hour after the event.  He initially was able to ambulate on it but since the event he has not been able to ambulate without crutches.  He is been wearing a wrap on the ankle.  He  has good sensation to the toes and the swelling is slightly better but still pretty prominent.  He is using naproxen as prescribed.  This does seem to be helping a little bit with the swelling.  He continues to use his pain medicine as prescribed.   ROS: Per HPI  No Known Allergies Past Medical History:  Diagnosis Date  . Anxiety   . Diabetes mellitus without complication ([place])     Current Outpatient Medications:  .  ALPRAZolam (XANAX) 1 MG tablet, 1 tab 2-3 times a day as needed for anxiety, Disp: 80 tablet, Rfl: 2 .  B-D INS SYRINGE 0.5CC/30GX1/2" 30G X 1/2" 0.5 ML MISC, 1 EACH BY DOES NOT APPLY ROUTE 2 (TWO) TIMES DAILY., Disp: , Rfl: 2 .  blood glucose meter kit and supplies KIT, Dispense based on patient and insurance preference. Use up to four times daily as directed. (FOR ICD-9 250.00, 250.01)., Disp: 1 each, Rfl: 0 .  gabapentin (NEURONTIN) 600 MG tablet, Take 600 mg by mouth 3 (three) times daily., Disp: , Rfl: 3 .  glucose blood (ONETOUCH ULTRA) test strip, Test BS 5 times daily Dx E10.9, Disp: 500 strip, Rfl: 3 .  HYDROcodone-acetaminophen (NORCO) 10-325 MG tablet, Take 1 tablet by mouth every 8 (eight) hours as needed., Disp: , Rfl:  .  insulin glargine (LANTUS SOLOSTAR) 100 UNIT/ML Solostar Pen, Inject 26-30 Units into the skin daily., Disp: 30  mL, Rfl: 3 .  insulin lispro (HUMALOG KWIKPEN) 100 UNIT/ML KwikPen, Inject 5-20 Units into the skin 3 (three) times daily. As directed, Disp: 45 mL, Rfl: 3 .  Insulin Syringe-Needle U-100 (B-D INS SYRINGE 0.5CC/30GX1/2") 30G X 1/2" 0.5 ML MISC, 1 each by Does not apply route 2 (two) times daily., Disp: 100 each, Rfl: 2 .  morphine (MS CONTIN) 15 MG 12 hr tablet, Take 15 mg by mouth every 12 (twelve) hours., Disp: , Rfl:  .  NARCAN 4 MG/0.1ML LIQD nasal spray kit, 1 (ONE) SPRAY AS NEEDED, Disp: , Rfl: 3 .  pravastatin (PRAVACHOL) 40 MG tablet, Take 1 tablet (40 mg total) by mouth daily., Disp: 90 tablet, Rfl: 3 .  tiZANidine (ZANAFLEX) 4  MG tablet, TAKE 1/2-1 TABLET BY MOUTH EVERY 6 HOURS AS NEEDED FOR MUSCLE SPASMS., Disp: 30 tablet, Rfl: 12 .  ULTICARE MICRO PEN NEEDLES 32G X 4 MM MISC, USE WITH INSULIN PEN DAILY, Disp: 100 each, Rfl: 6 Social History   Socioeconomic History  . Marital status: Married    Spouse name: Not on file  . Number of children: Not on file  . Years of education: Not on file  . Highest education level: Not on file  Occupational History  . Not on file  Tobacco Use  . Smoking status: Current Every Day Smoker    Packs/day: 0.25    Types: Cigarettes  . Smokeless tobacco: Never Used  Vaping Use  . Vaping Use: Never used  Substance and Sexual Activity  . Alcohol use: No  . Drug use: No  . Sexual activity: Yes  Other Topics Concern  . Not on file  Social History Narrative  . Not on file   Social Determinants of Health   Financial Resource Strain: Not on file  Food Insecurity: Not on file  Transportation Needs: Not on file  Physical Activity: Not on file  Stress: Not on file  Social Connections: Not on file  Intimate Partner Violence: Not on file   Family History  Problem Relation Age of Onset  . Healthy Mother   . Healthy Father     Objective: Office vital signs reviewed. BP 121/68   Pulse 100   Temp 97.9 F (36.6 C) (Temporal)   Ht 5' 11"  (1.803 m)   SpO2 98%   BMI 33.75 kg/m   Physical Examination:  General: Awake, alert, well nourished, No acute distress HEENT: Normal; sclera white Cardio: regular rate and rhythm, S1S2 heard, no murmurs appreciated Pulm: clear to auscultation bilaterally, no wheezes, rhonchi or rales; normal work of breathing on room air Extremities: warm, well perfused, No edema, cyanosis or clubbing; +2 pulses bilaterally MSK: Antalgic gait.  Using crutches for ambulation  Right ankle: Mild to moderate soft tissue swelling noted in the right foot.  He has exquisite tenderness to palpation over the ATFL and talus.  There is no appreciable ecchymosis  on exam but I did not unwrap his ankle  Assessment/ Plan: 45 y.o. male   Type 1 diabetes mellitus without complication ([place]) - Plan: Bayer DCA Hb A1c Waived, Microalbumin / creatinine urine ratio  Hyperlipidemia due to type 1 diabetes mellitus (HCC)  Generalized anxiety disorder - Plan: ToxASSURE Select 13 (MW), Urine, ALPRAZolam (XANAX) 1 MG tablet  High risk medication use - Plan: ToxASSURE Select 13 (MW), Urine  Controlled substance agreement signed - Plan: ToxASSURE Select 13 (MW), Urine  Chronic use of benzodiazepine for therapeutic purpose - Plan: ToxASSURE Select 13 (MW), Urine  Chronic, continuous use of opioids - Plan: ToxASSURE Select 13 (MW), Urine  Acute right ankle pain - Plan: Ambulatory referral to Orthopedic Surgery  Sugar with A1c of 7.1 today.  It is slightly better than check in December.  He is still working on trying to bring this down.  Has had a couple of hypoglycemic episodes who be very careful with eating sufficient amounts of food when he injects his insulin  Continue statin  On high risk medication combination but tries use below his effective dose.  No red flag symptoms or signs.  National narcotic database was reviewed and there were no red flags.  Next anticipated fill will be around 24 March.  UDS and CSC were updated as per office policy today  Given his ongoing right ankle pain, quite concerned that he may have a severe sprain of the right ankle versus tear of the ATFL.  He was exquisitely tender to palpation on exam.  I reviewed the x-ray study result in the EMR which did not demonstrate a fracture.  Urgent referral to orthopedics has been placed given history of hardware placement in the past in this foot.  He can continue NSAID, opioids as prescribed.  I offered Toradol injection today but he did not feel that he needed this today.     No orders of the defined types were placed in this encounter.  No orders of the defined types were placed in  this encounter.    Janora Norlander, DO [place] [phone]

## 2020-10-03 LAB — MICROALBUMIN / CREATININE URINE RATIO
Creatinine, Urine: 135.1 mg/dL
Microalb/Creat Ratio: <2 mg/g creat (ref 0–29)
Microalbumin, Urine: <3 ug/mL

## 2020-10-09 ENCOUNTER — Encounter: Payer: Self-pay | Admitting: Family Medicine

## 2020-10-09 LAB — TOXASSURE SELECT 13 (MW), URINE

## 2020-10-30 LAB — D/L METHAMPHET, TOXASSURE ADD
d-Methamphetamine: NOT DETECTED %
l-Methamphetamine: NOT DETECTED %

## 2020-10-30 LAB — SPECIMEN STATUS REPORT

## 2020-11-25 ENCOUNTER — Other Ambulatory Visit: Payer: Self-pay | Admitting: Family Medicine

## 2020-11-25 DIAGNOSIS — E1069 Type 1 diabetes mellitus with other specified complication: Secondary | ICD-10-CM

## 2021-01-05 ENCOUNTER — Ambulatory Visit: Payer: 59 | Admitting: Family Medicine

## 2021-01-15 ENCOUNTER — Other Ambulatory Visit: Payer: Self-pay | Admitting: Family Medicine

## 2021-01-15 DIAGNOSIS — M25511 Pain in right shoulder: Secondary | ICD-10-CM

## 2021-01-15 DIAGNOSIS — G8929 Other chronic pain: Secondary | ICD-10-CM

## 2021-02-20 ENCOUNTER — Other Ambulatory Visit: Payer: Self-pay | Admitting: Family Medicine

## 2021-02-20 DIAGNOSIS — F411 Generalized anxiety disorder: Secondary | ICD-10-CM

## 2021-02-21 ENCOUNTER — Other Ambulatory Visit: Payer: Self-pay | Admitting: Family Medicine

## 2021-02-21 DIAGNOSIS — E1069 Type 1 diabetes mellitus with other specified complication: Secondary | ICD-10-CM

## 2021-02-23 ENCOUNTER — Other Ambulatory Visit: Payer: Self-pay | Admitting: Family Medicine

## 2021-02-23 DIAGNOSIS — G8929 Other chronic pain: Secondary | ICD-10-CM

## 2021-02-23 DIAGNOSIS — M25511 Pain in right shoulder: Secondary | ICD-10-CM

## 2021-02-28 ENCOUNTER — Encounter: Payer: Self-pay | Admitting: Family Medicine

## 2021-02-28 ENCOUNTER — Telehealth: Payer: 59 | Admitting: Family Medicine

## 2021-02-28 DIAGNOSIS — F411 Generalized anxiety disorder: Secondary | ICD-10-CM | POA: Diagnosis not present

## 2021-02-28 DIAGNOSIS — E1069 Type 1 diabetes mellitus with other specified complication: Secondary | ICD-10-CM | POA: Diagnosis not present

## 2021-02-28 DIAGNOSIS — F1190 Opioid use, unspecified, uncomplicated: Secondary | ICD-10-CM | POA: Diagnosis not present

## 2021-02-28 DIAGNOSIS — E785 Hyperlipidemia, unspecified: Secondary | ICD-10-CM

## 2021-02-28 DIAGNOSIS — E109 Type 1 diabetes mellitus without complications: Secondary | ICD-10-CM | POA: Diagnosis not present

## 2021-02-28 DIAGNOSIS — Z79899 Other long term (current) drug therapy: Secondary | ICD-10-CM

## 2021-02-28 MED ORDER — ALPRAZOLAM 1 MG PO TABS
ORAL_TABLET | ORAL | 2 refills | Status: DC
Start: 2021-02-28 — End: 2021-06-19

## 2021-02-28 MED ORDER — ULTICARE MICRO PEN NEEDLES 32G X 4 MM MISC: 6 refills | Status: DC

## 2021-02-28 MED ORDER — PRAVASTATIN SODIUM 40 MG PO TABS: 40.0000 mg | ORAL_TABLET | Freq: Every day | ORAL | 3 refills | Status: DC

## 2021-02-28 MED ORDER — LANTUS SOLOSTAR 100 UNIT/ML ~~LOC~~ SOPN: 30.0000 [IU] | PEN_INJECTOR | Freq: Two times a day (BID) | SUBCUTANEOUS | 3 refills | Status: DC

## 2021-02-28 NOTE — Progress Notes (Signed)
MyChart Video visit  Subjective: CC: DM, GAD PCP: Janora Norlander, DO [id number] Gary Stone is a 45 y.o. male. Patient provides verbal consent for consult held via video.  Due to COVID-19 pandemic this visit was conducted virtually. This visit type was conducted due to national recommendations for restrictions regarding the COVID-19 Pandemic (e.g. social distancing, sheltering in place) in an effort to limit this patient's exposure and mitigate transmission in our community. All issues noted in this document were discussed and addressed.  A physical exam was not performed with this format.   Location of patient: work Location of provider: [facility] Others present for call: none  1. DM1 BGs have been a little on the lower side due to being active.  On average BGs 90. H 100.  No symptomatic hypoglycemia.  2. Panic attack/GAD Patient continues to use the Xanax 2-3 tablets per day.  No alcohol use. No sedation, falls, respiratory depression (uses opioids from pain management as well), visual or auditory hallucinations.   ROS: Per HPI  No Known Allergies Past Medical History:  Diagnosis Date   Anxiety    Diabetes mellitus without complication ([place])     Current Outpatient Medications:    ALPRAZolam (XANAX) 1 MG tablet, 1 tab 2-3 times a day as needed for anxiety, Disp: 80 tablet, Rfl: 2   B-D INS SYRINGE 0.5CC/30GX1/2" 30G X 1/2" 0.5 ML MISC, 1 EACH BY DOES NOT APPLY ROUTE 2 (TWO) TIMES DAILY., Disp: , Rfl: 2   blood glucose meter kit and supplies KIT, Dispense based on patient and insurance preference. Use up to four times daily as directed. (FOR ICD-9 250.00, 250.01)., Disp: 1 each, Rfl: 0   gabapentin (NEURONTIN) 600 MG tablet, Take 600 mg by mouth 3 (three) times daily., Disp: , Rfl: 3   glucose blood (ONETOUCH ULTRA) test strip, TEST BLOOD SUGAR 5 TIMES DAILY DX E10.9, Disp: 500 strip, Rfl: 2   HYDROcodone-acetaminophen (NORCO) 10-325 MG tablet, Take 1 tablet by mouth every 8 (eight)  hours as needed., Disp: , Rfl:    insulin glargine (LANTUS SOLOSTAR) 100 UNIT/ML Solostar Pen, Inject 26-30 Units into the skin daily., Disp: 30 mL, Rfl: 3   insulin lispro (HUMALOG KWIKPEN) 100 UNIT/ML KwikPen, Inject 5-20 Units into the skin 3 (three) times daily. As directed, Disp: 45 mL, Rfl: 3   Insulin Syringe-Needle U-100 (B-D INS SYRINGE 0.5CC/30GX1/2") 30G X 1/2" 0.5 ML MISC, 1 each by Does not apply route 2 (two) times daily., Disp: 100 each, Rfl: 2   morphine (MS CONTIN) 15 MG 12 hr tablet, Take 15 mg by mouth every 12 (twelve) hours., Disp: , Rfl:    NARCAN 4 MG/0.1ML LIQD nasal spray kit, 1 (ONE) SPRAY AS NEEDED, Disp: , Rfl: 3   pravastatin (PRAVACHOL) 40 MG tablet, TAKE 1 TABLET BY MOUTH EVERY DAY, Disp: 90 tablet, Rfl: 0   tiZANidine (ZANAFLEX) 4 MG tablet, TAKE 1/2-1 TABLET BY MOUTH EVERY 6 HOURS AS NEEDED FOR MUSCLE SPASMS., Disp: 90 tablet, Rfl: 1   ULTICARE MICRO PEN NEEDLES 32G X 4 MM MISC, USE WITH INSULIN PEN DAILY, Disp: 100 each, Rfl: 6  Gen: Well, nontoxic male Psych: Mood stable, speech normal.  Thought process linear.  Good eye contact.  Does not appear to be responding to internal stimuli  Depression screen [facility] 2/9 02/28/2021 10/02/2020 07/05/2020  Decreased Interest 0 0 0  Down, Depressed, Hopeless 0 0 0  PHQ - 2 Score 0 0 0  Altered sleeping - 0 -  Tired,  decreased energy - 0 -  Change in appetite - 0 -  Feeling bad or failure about yourself  - 0 -  Trouble concentrating - 0 -  Moving slowly or fidgety/restless - 0 -  Suicidal thoughts - 0 -  PHQ-9 Score - 0 -   GAD 7 : Generalized Anxiety Score 02/28/2021 03/31/2019  Nervous, Anxious, on Edge 1 1  Control/stop worrying 0 1  Worry too much - different things 0 0  Trouble relaxing 0 0  Restless 0 0  Easily annoyed or irritable 1 1  Afraid - awful might happen 0 0  Total GAD 7 Score 2 3  Anxiety Difficulty Not difficult at all Not difficult at all    Assessment/ Plan: 45 y.o. male   Type 1 diabetes  mellitus without complication (HCC) - Plan: insulin glargine (LANTUS SOLOSTAR) 100 UNIT/ML Solostar Pen, Insulin Pen Needle (ULTICARE MICRO PEN NEEDLES) 32G X 4 MM MISC  Hyperlipidemia due to type 1 diabetes mellitus (HCC) - Plan: pravastatin (PRAVACHOL) 40 MG tablet  Generalized anxiety disorder - Plan: ALPRAZolam (XANAX) 1 MG tablet  Chronic, continuous use of opioids  High risk medication use  Sugar sounds controlled based on measurements at home.  I have updated his Lantus prescription to reflect twice daily dosing.  We erroneously had daily dosing and he was running out prematurely.  I also sent back in his administration supplies.  Continue Pravachol.  This is been renewed.  Plan for fasting lipid panel at next visit as he is overdue for laboratory work-up  Anxiety disorder is chronic and stable.  He is on a high risk medication regimen with benzodiazepine prescribed by myself that is coadministered with opioids by his pain specialist.  He currently is exhibiting no red flag signs or symptoms and understands the risks of utilization of both of these classes of medication together.  The Narcotic Database has been reviewed.  There were no red flags.     Start time: 3:55pm End time: 4:09pm  Total time spent on patient care (including video visit/ documentation): 14 minutes  [address], [place] [phone]

## 2021-03-21 ENCOUNTER — Other Ambulatory Visit: Payer: Self-pay | Admitting: Family Medicine

## 2021-03-21 DIAGNOSIS — E109 Type 1 diabetes mellitus without complications: Secondary | ICD-10-CM

## 2021-04-04 ENCOUNTER — Other Ambulatory Visit: Payer: Self-pay | Admitting: Family Medicine

## 2021-04-04 DIAGNOSIS — G8929 Other chronic pain: Secondary | ICD-10-CM

## 2021-04-04 DIAGNOSIS — M25511 Pain in right shoulder: Secondary | ICD-10-CM

## 2021-05-23 ENCOUNTER — Other Ambulatory Visit: Payer: Self-pay | Admitting: Family Medicine

## 2021-05-23 DIAGNOSIS — M25511 Pain in right shoulder: Secondary | ICD-10-CM

## 2021-05-24 NOTE — Telephone Encounter (Signed)
Gottschalk coverage... Last office visit 02/28/21 Last refill 04/06/21, #90, 1 refill

## 2021-06-02 ENCOUNTER — Other Ambulatory Visit: Payer: Self-pay | Admitting: Family Medicine

## 2021-06-02 DIAGNOSIS — F411 Generalized anxiety disorder: Secondary | ICD-10-CM

## 2021-06-08 ENCOUNTER — Encounter: Payer: Self-pay | Admitting: Family Medicine

## 2021-06-08 NOTE — Telephone Encounter (Signed)
Put him in my 4pm on Monday. IN PERSON.

## 2021-06-19 ENCOUNTER — Ambulatory Visit: Payer: 59 | Admitting: Family Medicine

## 2021-06-19 ENCOUNTER — Other Ambulatory Visit: Payer: Self-pay

## 2021-06-19 ENCOUNTER — Encounter: Payer: Self-pay | Admitting: Family Medicine

## 2021-06-19 VITALS — BP 111/61 | HR 87 | Temp 98.2°F | Ht 71.0 in | Wt 228.8 lb

## 2021-06-19 DIAGNOSIS — E785 Hyperlipidemia, unspecified: Secondary | ICD-10-CM

## 2021-06-19 DIAGNOSIS — E109 Type 1 diabetes mellitus without complications: Secondary | ICD-10-CM | POA: Diagnosis not present

## 2021-06-19 DIAGNOSIS — Z79899 Other long term (current) drug therapy: Secondary | ICD-10-CM | POA: Diagnosis not present

## 2021-06-19 DIAGNOSIS — F411 Generalized anxiety disorder: Secondary | ICD-10-CM

## 2021-06-19 DIAGNOSIS — E1069 Type 1 diabetes mellitus with other specified complication: Secondary | ICD-10-CM

## 2021-06-19 LAB — BAYER DCA HB A1C WAIVED: HB A1C (BAYER DCA - WAIVED): 7.5 % — ABNORMAL HIGH (ref 4.8–5.6)

## 2021-06-19 MED ORDER — ALPRAZOLAM 1 MG PO TABS: ORAL_TABLET | ORAL | 5 refills | Status: DC

## 2021-06-19 NOTE — Progress Notes (Signed)
Subjective: CC: Anxiety disorder PCP: Janora Norlander, DO Gary Stone is a 45 y.o. male presenting to clinic today for:  1.  Anxiety disorder Patient with anxiety disorder with panic.  He is chronically treated with Xanax 1 mg up to 3 times daily but typically only uses this twice daily.  He is concomitantly treated with opioid by pain management.  Denies any respiratory depression, excessive daytime sedation, falls, visual or auditory hallucinations or changes in memory.  2.  Type 1 diabetes with hyperlipidemia Patient is compliant with insulin but admits that he is not injecting as much as he had been due to recent extraction of both upper and lower teeth.  He has new injury but they have not fit very well so that weight that he eats has changed drastically.  He was worried about inducing hypoglycemias as he has had some increased frequency of hypoglycemic episodes that have woken him up from sleep.  Denies polydipsia, polyuria, visual disturbance, abdominal pain, nausea, vomiting or diaphoresis.   ROS: Per HPI  No Known Allergies Past Medical History:  Diagnosis Date   Anxiety    Diabetes mellitus without complication ([place])     Current Outpatient Medications:    ALPRAZolam (XANAX) 1 MG tablet, 1 tab 2-3 times a day as needed for anxiety, Disp: 80 tablet, Rfl: 2   B-D INS SYRINGE 0.5CC/30GX1/2" 30G X 1/2" 0.5 ML MISC, 1 EACH BY DOES NOT APPLY ROUTE 2 (TWO) TIMES DAILY., Disp: , Rfl: 2   blood glucose meter kit and supplies KIT, Dispense based on patient and insurance preference. Use up to four times daily as directed. (FOR ICD-9 250.00, 250.01)., Disp: 1 each, Rfl: 0   gabapentin (NEURONTIN) 800 MG tablet, Take 800 mg by mouth 3 (three) times daily., Disp: , Rfl:    glucose blood (ONETOUCH ULTRA) test strip, TEST BLOOD SUGAR 5 TIMES DAILY DX E10.9, Disp: 500 strip, Rfl: 2   HYDROcodone-acetaminophen (NORCO) 10-325 MG tablet, Take 1 tablet by mouth every 8 (eight) hours as  needed., Disp: , Rfl:    insulin glargine (LANTUS SOLOSTAR) 100 UNIT/ML Solostar Pen, Inject 30 Units into the skin 2 (two) times daily., Disp: 60 mL, Rfl: 3   insulin lispro (HUMALOG KWIKPEN) 100 UNIT/ML KwikPen, Inject 5-20 Units into the skin 3 (three) times daily. As directed, Disp: 45 mL, Rfl: 3   Insulin Pen Needle (ULTICARE MICRO PEN NEEDLES) 32G X 4 MM MISC, USE WITH INSULIN PEN DAILY Dx E10.9, Disp: 100 each, Rfl: 6   Insulin Syringe-Needle U-100 (B-D INS SYRINGE 0.5CC/30GX1/2") 30G X 1/2" 0.5 ML MISC, 1 each by Does not apply route 2 (two) times daily., Disp: 100 each, Rfl: 2   morphine (MS CONTIN) 15 MG 12 hr tablet, Take 15 mg by mouth every 12 (twelve) hours., Disp: , Rfl:    NARCAN 4 MG/0.1ML LIQD nasal spray kit, 1 (ONE) SPRAY AS NEEDED, Disp: , Rfl: 3   pravastatin (PRAVACHOL) 40 MG tablet, Take 1 tablet (40 mg total) by mouth daily., Disp: 90 tablet, Rfl: 3   tiZANidine (ZANAFLEX) 4 MG tablet, TAKE 1/2-1 TABLET BY MOUTH EVERY 6 HOURS AS NEEDED FOR MUSCLE SPASMS., Disp: 90 tablet, Rfl: 1 Social History   Socioeconomic History   Marital status: Married    Spouse name: Not on file   Number of children: Not on file   Years of education: Not on file   Highest education level: Not on file  Occupational History   Not on file  Tobacco Use   Smoking status: Every Day    Packs/day: 0.25    Types: Cigarettes   Smokeless tobacco: Never  Vaping Use   Vaping Use: Never used  Substance and Sexual Activity   Alcohol use: No   Drug use: No   Sexual activity: Yes  Other Topics Concern   Not on file  Social History Narrative   Not on file   Social Determinants of Health   Financial Resource Strain: Not on file  Food Insecurity: Not on file  Transportation Needs: Not on file  Physical Activity: Not on file  Stress: Not on file  Social Connections: Not on file  Intimate Partner Violence: Not on file   Family History  Problem Relation Age of Onset   Healthy Mother     Healthy Father     Objective: Office vital signs reviewed. BP 111/61   Pulse 87   Temp 98.2 F (36.8 C) (Temporal)   Ht 5' 11"  (1.803 m)   Wt 228 lb 12.8 oz (103.8 kg)   SpO2 96%   BMI 31.91 kg/m   Physical Examination:  General: Awake, alert, well nourished, No acute distress HEENT: Follows upper and lower teeth. Cardio: regular rate and rhythm, S1S2 heard, no murmurs appreciated Pulm: clear to auscultation bilaterally, no wheezes, rhonchi or rales; normal work of breathing on room air Neuro: No tremor.  Alert and oriented Psych: Mood stable, speech normal, affect appropriate  Assessment/ Plan: 45 y.o. male   Type 1 diabetes mellitus without complication ([place]) - Plan: Bayer DCA Hb A1c Waived  Hyperlipidemia due to type 1 diabetes mellitus ([place]) - Plan: CMP14+EGFR, Lipid panel  Generalized anxiety disorder - Plan: ALPRAZolam (XANAX) 1 MG tablet  High risk medication use  A1c demonstrates uncontrolled diabetes at 7.5 today.  I think this is complicated by recent dental work and concern for hypoglycemia since he is not eating his normal amounts of food  Nonfasting lipid panel was obtained  Panic disorder is stable.  Continues to try and use Xanax sparingly.  Last refill 05/02/2021.  Continues on opioid.  No red flag signs or symptoms.  [place] narcotic database reviewed and there were no red flags  May follow-up in 5 months given ongoing issues with work and ongoing stability.  Plan for full physical exam with fasting labs at that visit  Orders Placed This Encounter  Procedures   CMP14+EGFR   Lipid panel   Bayer DCA Hb A1c Waived   No orders of the defined types were placed in this encounter.    Janora Norlander, DO [place] [phone]

## 2021-06-20 LAB — CMP14+EGFR
ALT: 13 IU/L (ref 0–44)
AST: 16 IU/L (ref 0–40)
Albumin/Globulin Ratio: 1.8 (ref 1.2–2.2)
Albumin: 4.2 g/dL (ref 4.0–5.0)
Alkaline Phosphatase: 71 IU/L (ref 44–121)
BUN/Creatinine Ratio: 13 (ref 9–20)
BUN: 12 mg/dL (ref 6–24)
Bilirubin Total: 0.4 mg/dL (ref 0.0–1.2)
CO2: 26 mmol/L (ref 20–29)
Calcium: 9.4 mg/dL (ref 8.7–10.2)
Chloride: 100 mmol/L (ref 96–106)
Creatinine, Ser: 0.89 mg/dL (ref 0.76–1.27)
Globulin, Total: 2.3 g/dL (ref 1.5–4.5)
Glucose: 297 mg/dL — ABNORMAL HIGH (ref 70–99)
Potassium: 4 mmol/L (ref 3.5–5.2)
Sodium: 138 mmol/L (ref 134–144)
Total Protein: 6.5 g/dL (ref 6.0–8.5)
eGFR: 108 mL/min/{1.73_m2} (ref >59)

## 2021-06-20 LAB — LIPID PANEL
Chol/HDL Ratio: 4.2 ratio (ref 0.0–5.0)
Cholesterol, Total: 146 mg/dL (ref 100–199)
HDL: 35 mg/dL — ABNORMAL LOW (ref >39)
LDL Chol Calc (NIH): 56 mg/dL (ref 0–99)
Triglycerides: 361 mg/dL — ABNORMAL HIGH (ref 0–149)
VLDL Cholesterol Cal: 55 mg/dL — ABNORMAL HIGH (ref 5–40)

## 2021-06-26 NOTE — Progress Notes (Signed)
Pt returning call

## 2021-07-11 ENCOUNTER — Other Ambulatory Visit: Payer: Self-pay | Admitting: Nurse Practitioner

## 2021-07-11 DIAGNOSIS — G8929 Other chronic pain: Secondary | ICD-10-CM

## 2021-09-16 ENCOUNTER — Other Ambulatory Visit: Payer: Self-pay | Admitting: Family Medicine

## 2021-09-16 DIAGNOSIS — M25511 Pain in right shoulder: Secondary | ICD-10-CM

## 2021-09-16 DIAGNOSIS — G8929 Other chronic pain: Secondary | ICD-10-CM

## 2021-09-17 NOTE — Telephone Encounter (Signed)
Last office visit 06/19/21 Last refill 07/13/21, #90, 1 refill

## 2021-09-28 ENCOUNTER — Other Ambulatory Visit: Payer: Self-pay | Admitting: Family Medicine

## 2021-10-28 ENCOUNTER — Other Ambulatory Visit: Payer: Self-pay | Admitting: Family Medicine

## 2021-10-28 DIAGNOSIS — G8929 Other chronic pain: Secondary | ICD-10-CM

## 2021-11-19 ENCOUNTER — Encounter: Payer: Self-pay | Admitting: Family Medicine

## 2021-11-19 ENCOUNTER — Ambulatory Visit (INDEPENDENT_AMBULATORY_CARE_PROVIDER_SITE_OTHER): Payer: 59 | Admitting: Family Medicine

## 2021-11-19 VITALS — BP 138/86 | HR 90 | Temp 98.2°F | Ht 71.0 in | Wt 246.2 lb

## 2021-11-19 DIAGNOSIS — Z79899 Other long term (current) drug therapy: Secondary | ICD-10-CM

## 2021-11-19 DIAGNOSIS — R41840 Attention and concentration deficit: Secondary | ICD-10-CM

## 2021-11-19 DIAGNOSIS — E785 Hyperlipidemia, unspecified: Secondary | ICD-10-CM

## 2021-11-19 DIAGNOSIS — Z0001 Encounter for general adult medical examination with abnormal findings: Secondary | ICD-10-CM

## 2021-11-19 DIAGNOSIS — F411 Generalized anxiety disorder: Secondary | ICD-10-CM

## 2021-11-19 DIAGNOSIS — E1069 Type 1 diabetes mellitus with other specified complication: Secondary | ICD-10-CM

## 2021-11-19 DIAGNOSIS — Z1211 Encounter for screening for malignant neoplasm of colon: Secondary | ICD-10-CM

## 2021-11-19 LAB — BAYER DCA HB A1C WAIVED: HB A1C (BAYER DCA - WAIVED): 6.4 % — ABNORMAL HIGH (ref 4.8–5.6)

## 2021-11-19 MED ORDER — INSULIN PEN NEEDLE 32G X 6 MM MISC: 3 refills | Status: DC

## 2021-11-19 MED ORDER — ALPRAZOLAM 1 MG PO TABS: ORAL_TABLET | ORAL | 5 refills | Status: DC

## 2021-11-19 MED ORDER — FREESTYLE LIBRE 3 SENSOR MISC: 99 refills | Status: DC

## 2021-11-19 NOTE — Patient Instructions (Signed)
Gary Stone will call you with diabetic eye exam dates ?Cologuard ordered for colon cancer screening.  Return ASAP ?Freestyle libre ordered ? ?Preventive Care 46-46 Years Old, Male ?Preventive care refers to lifestyle choices and visits with your health care provider that can promote health and wellness. Preventive care visits are also called wellness exams. ?What can I expect for my preventive care visit? ?Counseling ?During your preventive care visit, your health care provider may ask about your: ?Medical history, including: ?Past medical problems. ?Family medical history. ?Current health, including: ?Emotional well-being. ?Home life and relationship well-being. ?Sexual activity. ?Lifestyle, including: ?Alcohol, nicotine or tobacco, and drug use. ?Access to firearms. ?Diet, exercise, and sleep habits. ?Safety issues such as seatbelt and bike helmet use. ?Sunscreen use. ?Work and work Astronomer. ?Physical exam ?Your health care provider will check your: ?Height and weight. These may be used to calculate your BMI (body mass index). BMI is a measurement that tells if you are at a healthy weight. ?Waist circumference. This measures the distance around your waistline. This measurement also tells if you are at a healthy weight and may help predict your risk of certain diseases, such as type 2 diabetes and high blood pressure. ?Heart rate and blood pressure. ?Body temperature. ?Skin for abnormal spots. ?What immunizations do I need? ? ?Vaccines are usually given at various ages, according to a schedule. Your health care provider will recommend vaccines for you based on your age, medical history, and lifestyle or other factors, such as travel or where you work. ?What tests do I need? ?Screening ?Your health care provider may recommend screening tests for certain conditions. This may include: ?Lipid and cholesterol levels. ?Diabetes screening. This is done by checking your blood sugar (glucose) after you have not eaten for a  while (fasting). ?Hepatitis B test. ?Hepatitis C test. ?HIV (human immunodeficiency virus) test. ?STI (sexually transmitted infection) testing, if you are at risk. ?Lung cancer screening. ?Prostate cancer screening. ?Colorectal cancer screening. ?Talk with your health care provider about your test results, treatment options, and if necessary, the need for more tests. ?Follow these instructions at home: ?Eating and drinking ? ?Eat a diet that includes fresh fruits and vegetables, whole grains, lean protein, and low-fat dairy products. ?Take vitamin and mineral supplements as recommended by your health care provider. ?Do not drink alcohol if your health care provider tells you not to drink. ?If you drink alcohol: ?Limit how much you have to 0-2 drinks a day. ?Know how much alcohol is in your drink. In the [place] one drink equals one 12 oz bottle of beer (355 mL), one 5 oz glass of wine (148 mL), or one 1? oz glass of hard liquor (44 mL). ?Lifestyle ?Brush your teeth every morning and night with fluoride toothpaste. Floss one time each day. ?Exercise for at least 30 minutes 5 or more days each week. ?Do not use any products that contain nicotine or tobacco. These products include cigarettes, chewing tobacco, and vaping devices, such as e-cigarettes. If you need help quitting, ask your health care provider. ?Do not use drugs. ?If you are sexually active, practice safe sex. Use a condom or other form of protection to prevent STIs. ?Take aspirin only as told by your health care provider. Make sure that you understand how much to take and what form to take. Work with your health care provider to find out whether it is safe and beneficial for you to take aspirin daily. ?Find healthy ways to manage stress, such as: ?Meditation, yoga, or  listening to music. ?Journaling. ?Talking to a trusted person. ?Spending time with friends and family. ?Minimize exposure to UV radiation to reduce your risk of skin cancer. ?Safety ?Always  wear your seat belt while driving or riding in a vehicle. ?Do not drive: ?If you have been drinking alcohol. Do not ride with someone who has been drinking. ?When you are tired or distracted. ?While texting. ?If you have been using any mind-altering substances or drugs. ?Wear a helmet and other protective equipment during sports activities. ?If you have firearms in your house, make sure you follow all gun safety procedures. ?What's next? ?Go to your health care provider once a year for an annual wellness visit. ?Ask your health care provider how often you should have your eyes and teeth checked. ?Stay up to date on all vaccines. ?This information is not intended to replace advice given to you by your health care provider. Make sure you discuss any questions you have with your health care provider. ?Document Revised: 01/10/2021 Document Reviewed: 01/10/2021 ?Elsevier Patient Education ? 2023 [employer] ? ?

## 2021-11-19 NOTE — Progress Notes (Signed)
? ?Gary Stone is a 46 y.o. male presents to office today for annual physical exam examination.   ? ?Concerns today include: ?1. Type 1 Diabetes with hyperlipidemia:  ?Patient is compliant with his insulins but he does not feel like he is getting adequate injection.  He is currently utilizing 32 G x 4 millimeters pen needles and often when he removes the injection it continues to squirt out.  He wonders if he is actually getting the full injection or not.  Does not reporting extremely high blood sugars but admits that he is having difficulty extracting blood from his fingertips.  He is interested in pursuing the freestyle libre if possible.  He is compliant with Pravachol.  No needs for refills on any of his chronic diabetes or blood cholesterol medications ? ?Last eye exam: Has not yet scheduled ?Last foot exam: Would like to defer to next visit ?Last A1c:  ?Lab Results  ?Component Value Date  ? HGBA1C 7.5 (H) 06/19/2021  ? ?Nephropathy screen indicated?:  Needs ?Last flu, zoster and/or pneumovax:  ?Immunization History  ?Administered Date(s) Administered  ? DTaP 07/20/2016  ? Pneumococcal Polysaccharide-23 11/03/2017  ? Td 07/20/2016  ? ? ?ROS: Denies dizziness, LOC, polyuria, polydipsia, unintended weight loss/gain, foot ulcerations, numbness or tingling in extremities, shortness of breath or chest pain. ? ?2.  Anxiety disorder ?Patient reports ongoing use of alprazolam 1 mg 2-3 times per day.  Denies any excessive daytime sedation, falls, respiratory depression, visual or auditory hallucinations.  He continues to be treated with gabapentin, MS Contin and as needed Norco by his pain management center.  He has Narcan on hand if needed.  He denies any complications with concomitant use of these.  He does report some difficulty with concentration and after further research, feels that he have adult onset ADHD but would like to have a further evaluation for this. ? ?Occupation: Dealer, Marital status: Married,  Substance use: None ?Diet: Fair, Exercise: No structured ?Last eye exam: Needs ?Last colonoscopy: Needs.  Prefers Cologuard ?Refills needed today: Alprazolam ?Immunizations needed: ?Immunization History  ?Administered Date(s) Administered  ? DTaP 07/20/2016  ? Pneumococcal Polysaccharide-23 11/03/2017  ? Td 07/20/2016  ? ? ? ?Past Medical History:  ?Diagnosis Date  ? Anxiety   ? Diabetes mellitus without complication ([place])   ? ?Social History  ? ?Socioeconomic History  ? Marital status: Married  ?  Spouse name: Not on file  ? Number of children: Not on file  ? Years of education: Not on file  ? Highest education level: Not on file  ?Occupational History  ? Not on file  ?Tobacco Use  ? Smoking status: Every Day  ?  Packs/day: 0.25  ?  Types: Cigarettes  ? Smokeless tobacco: Never  ?Vaping Use  ? Vaping Use: Never used  ?Substance and Sexual Activity  ? Alcohol use: No  ? Drug use: No  ? Sexual activity: Yes  ?Other Topics Concern  ? Not on file  ?Social History Narrative  ? Not on file  ? ?Social Determinants of Health  ? ?Financial Resource Strain: Not on file  ?Food Insecurity: Not on file  ?Transportation Needs: Not on file  ?Physical Activity: Not on file  ?Stress: Not on file  ?Social Connections: Not on file  ?Intimate Partner Violence: Not on file  ? ?Past Surgical History:  ?Procedure Laterality Date  ? ANKLE SURGERY Right   ? DENTAL SURGERY Bilateral   ? new upper, then did lower teeth removal & dentures  ? ?  Family History  ?Problem Relation Age of Onset  ? Healthy Mother   ? Healthy Father   ? ? ?Current Outpatient Medications:  ?  ALPRAZolam (XANAX) 1 MG tablet, 1 tab 2-3 times a day as needed for anxiety, Disp: 80 tablet, Rfl: 5 ?  B-D INS SYRINGE 0.5CC/30GX1/2" 30G X 1/2" 0.5 ML MISC, 1 EACH BY DOES NOT APPLY ROUTE 2 (TWO) TIMES DAILY., Disp: , Rfl: 2 ?  blood glucose meter kit and supplies KIT, Dispense based on patient and insurance preference. Use up to four times daily as directed. (FOR ICD-9 250.00,  250.01)., Disp: 1 each, Rfl: 0 ?  gabapentin (NEURONTIN) 800 MG tablet, Take 800 mg by mouth 3 (three) times daily., Disp: , Rfl:  ?  glucose blood (ONETOUCH ULTRA) test strip, TEST BLOOD SUGAR 5 TIMES DAILY DX E10.9, Disp: 500 strip, Rfl: 2 ?  HYDROcodone-acetaminophen (NORCO) 10-325 MG tablet, Take 1 tablet by mouth every 8 (eight) hours as needed., Disp: , Rfl:  ?  insulin glargine (LANTUS SOLOSTAR) 100 UNIT/ML Solostar Pen, Inject 30 Units into the skin 2 (two) times daily., Disp: 60 mL, Rfl: 3 ?  insulin lispro (HUMALOG) 100 UNIT/ML KwikPen, INJECT 5-20 UNITS INTO THE SKIN 3 (THREE) TIMES DAILY. AS DIRECTED, Disp: 45 mL, Rfl: 0 ?  Insulin Pen Needle (ULTICARE MICRO PEN NEEDLES) 32G X 4 MM MISC, USE WITH INSULIN PEN DAILY Dx E10.9, Disp: 100 each, Rfl: 6 ?  Insulin Syringe-Needle U-100 (B-D INS SYRINGE 0.5CC/30GX1/2") 30G X 1/2" 0.5 ML MISC, 1 each by Does not apply route 2 (two) times daily., Disp: 100 each, Rfl: 2 ?  morphine (MS CONTIN) 15 MG 12 hr tablet, Take 15 mg by mouth every 12 (twelve) hours., Disp: , Rfl:  ?  NARCAN 4 MG/0.1ML LIQD nasal spray kit, 1 (ONE) SPRAY AS NEEDED, Disp: , Rfl: 3 ?  pravastatin (PRAVACHOL) 40 MG tablet, Take 1 tablet (40 mg total) by mouth daily., Disp: 90 tablet, Rfl: 3 ?  tiZANidine (ZANAFLEX) 4 MG tablet, TAKE 1/2-1 TABLET BY MOUTH EVERY 6 HOURS AS NEEDED FOR MUSCLE SPASMS., Disp: 90 tablet, Rfl: 1 ? ?No Known Allergies  ? ?ROS: ?Review of Systems ?Pertinent items noted in HPI and remainder of comprehensive ROS otherwise negative.   ? ?Physical exam ?BP 138/86   Pulse 90   Temp 98.2 ?F (36.8 ?C)   Ht 5' 11"  (1.803 m)   Wt 246 lb 3.2 oz (111.7 kg)   SpO2 98%   BMI 34.34 kg/m?  ?General appearance: alert, cooperative, appears stated age, and no distress ?Head: Normocephalic, without obvious abnormality, atraumatic ?Eyes: negative findings: lids and lashes normal, conjunctivae and sclerae normal, corneas clear, and pupils equal, round, reactive to light and  accomodation ?Ears: normal TM's and external ear canals both ears ?Nose: Nares normal. Septum midline. Mucosa normal. No drainage or sinus tenderness. ?Throat:  Brown staining of the tongue.  No oropharyngeal or sublingual masses identified ?Neck: no adenopathy, no carotid bruit, supple, symmetrical, trachea midline, and thyroid not enlarged, symmetric, no tenderness/mass/nodules ?Back: symmetric, no curvature. ROM normal. No CVA tenderness. ?Lungs: clear to auscultation bilaterally ?Chest wall: no tenderness ?Heart: regular rate and rhythm, S1, S2 normal, no murmur, click, rub or gallop ?Abdomen: soft, non-tender; bowel sounds normal; no masses,  no organomegaly ?Extremities: extremities normal, atraumatic, no cyanosis or edema ?Pulses: 2+ and symmetric ?Skin: Skin color, texture, turgor normal. No rashes or lesions ?Lymph nodes: Cervical, supraclavicular, and axillary nodes normal. ?Neurologic: Grossly normal ?Psych: mood  stable ? ?  11/19/2021  ?  3:14 PM 06/19/2021  ?  3:10 PM 02/28/2021  ?  3:59 PM  ?Depression screen PHQ 2/9  ?Decreased Interest 0 0 0  ?Down, Depressed, Hopeless 0 0 0  ?PHQ - 2 Score 0 0 0  ?Altered sleeping  0   ?Tired, decreased energy  0   ?Change in appetite  0   ?Feeling bad or failure about yourself   0   ?Trouble concentrating  0   ?Moving slowly or fidgety/restless  0   ?Suicidal thoughts  0   ?PHQ-9 Score  0   ? ? ?  11/19/2021  ?  3:14 PM 06/19/2021  ?  3:10 PM 02/28/2021  ?  3:59 PM 03/31/2019  ?  2:39 PM  ?GAD 7 : Generalized Anxiety Score  ?Nervous, Anxious, on Edge 0 1 1 1   ?Control/stop worrying 0 1 0 1  ?Worry too much - different things 1 1 0 0  ?Trouble relaxing 1 1 0 0  ?Restless 0 0 0 0  ?Easily annoyed or irritable 0 0 1 1  ?Afraid - awful might happen 0 0 0 0  ?Total GAD 7 Score 2 4 2 3   ?Anxiety Difficulty Not difficult at all  Not difficult at all Not difficult at all  ? ? ? ? ? ?Assessment/ Plan: ?Gary Stone here for annual physical exam.  ? ?Type 1 diabetes mellitus with  other specified complication ([place]) - Plan: Microalbumin / creatinine urine ratio, Bayer DCA Hb A1c Waived, Continuous Blood Gluc Sensor (FREESTYLE LIBRE 3 SENSOR) MISC, Insulin Pen Needle 32G X 6 MM MISC ? ?Hyperlipid

## 2021-11-20 LAB — MICROALBUMIN / CREATININE URINE RATIO
Creatinine, Urine: 110.3 mg/dL
Microalb/Creat Ratio: <3 mg/g creat (ref 0–29)
Microalbumin, Urine: <3 ug/mL

## 2021-11-22 ENCOUNTER — Telehealth: Payer: Self-pay | Admitting: Family Medicine

## 2021-11-22 NOTE — Telephone Encounter (Signed)
PT AWARE  

## 2021-11-22 NOTE — Telephone Encounter (Signed)
Yep this was sent at his visit.  See MAR. ?

## 2021-11-22 NOTE — Telephone Encounter (Signed)
Pt says that rx for insulin needles was going to change in size. Change from 4 to 6? Use [facility] ?

## 2021-11-23 LAB — TOXASSURE SELECT 13 (MW), URINE

## 2021-11-27 ENCOUNTER — Encounter: Payer: Self-pay | Admitting: Family Medicine

## 2021-11-28 ENCOUNTER — Other Ambulatory Visit: Payer: Self-pay | Admitting: Family Medicine

## 2021-11-28 DIAGNOSIS — E1069 Type 1 diabetes mellitus with other specified complication: Secondary | ICD-10-CM

## 2021-11-28 MED ORDER — FREESTYLE LIBRE 2 SENSOR MISC: 3 refills | Status: DC

## 2021-11-28 NOTE — Telephone Encounter (Signed)
Can you check in on this and contact patient please? ?

## 2021-12-10 ENCOUNTER — Telehealth: Payer: Self-pay | Admitting: Family Medicine

## 2021-12-10 NOTE — Telephone Encounter (Signed)
Suspect he is in need for a Prior Auth ?

## 2021-12-10 NOTE — Telephone Encounter (Signed)
[name] ?Key: [id number] ?PA Case ID: [id number] ? ?Outcome: DENIED ? ?Drug: FreeStyle Libre 2 Sensor ? ? ?

## 2021-12-10 NOTE — Telephone Encounter (Signed)
Key: [id number] - PA Case ID: [id number] Need help? Call us at [phone] ?Status Sent to [employer] ?Drug FreeStyle Libre 2 Sensor ?

## 2021-12-11 ENCOUNTER — Other Ambulatory Visit: Payer: Self-pay | Admitting: Family Medicine

## 2021-12-11 DIAGNOSIS — G8929 Other chronic pain: Secondary | ICD-10-CM

## 2022-01-04 ENCOUNTER — Other Ambulatory Visit: Payer: Self-pay | Admitting: Family Medicine

## 2022-02-06 ENCOUNTER — Other Ambulatory Visit: Payer: Self-pay | Admitting: Family Medicine

## 2022-03-07 ENCOUNTER — Other Ambulatory Visit: Payer: Self-pay | Admitting: Family Medicine

## 2022-03-07 DIAGNOSIS — E109 Type 1 diabetes mellitus without complications: Secondary | ICD-10-CM

## 2022-04-04 ENCOUNTER — Other Ambulatory Visit: Payer: Self-pay | Admitting: Family Medicine

## 2022-04-04 DIAGNOSIS — E1069 Type 1 diabetes mellitus with other specified complication: Secondary | ICD-10-CM

## 2022-04-18 ENCOUNTER — Other Ambulatory Visit: Payer: Self-pay | Admitting: Family Medicine

## 2022-04-29 ENCOUNTER — Other Ambulatory Visit: Payer: Self-pay | Admitting: Family Medicine

## 2022-04-29 DIAGNOSIS — E1069 Type 1 diabetes mellitus with other specified complication: Secondary | ICD-10-CM

## 2022-05-18 ENCOUNTER — Other Ambulatory Visit: Payer: Self-pay | Admitting: Family Medicine

## 2022-05-18 DIAGNOSIS — G8929 Other chronic pain: Secondary | ICD-10-CM

## 2022-05-22 ENCOUNTER — Other Ambulatory Visit: Payer: Self-pay | Admitting: Family Medicine

## 2022-05-22 DIAGNOSIS — E109 Type 1 diabetes mellitus without complications: Secondary | ICD-10-CM

## 2022-05-23 ENCOUNTER — Other Ambulatory Visit: Payer: Self-pay | Admitting: Family Medicine

## 2022-05-23 DIAGNOSIS — E785 Hyperlipidemia, unspecified: Secondary | ICD-10-CM

## 2022-05-24 ENCOUNTER — Ambulatory Visit: Payer: 59 | Admitting: Family Medicine

## 2022-05-27 ENCOUNTER — Other Ambulatory Visit: Payer: Self-pay | Admitting: Family Medicine

## 2022-05-27 DIAGNOSIS — F411 Generalized anxiety disorder: Secondary | ICD-10-CM

## 2022-05-31 ENCOUNTER — Telehealth (INDEPENDENT_AMBULATORY_CARE_PROVIDER_SITE_OTHER): Payer: 59 | Admitting: Family Medicine

## 2022-05-31 ENCOUNTER — Encounter: Payer: Self-pay | Admitting: Family Medicine

## 2022-05-31 ENCOUNTER — Ambulatory Visit (INDEPENDENT_AMBULATORY_CARE_PROVIDER_SITE_OTHER): Payer: 59 | Admitting: Family Medicine

## 2022-05-31 DIAGNOSIS — Z79899 Other long term (current) drug therapy: Secondary | ICD-10-CM

## 2022-05-31 DIAGNOSIS — E785 Hyperlipidemia, unspecified: Secondary | ICD-10-CM

## 2022-05-31 DIAGNOSIS — F411 Generalized anxiety disorder: Secondary | ICD-10-CM

## 2022-05-31 DIAGNOSIS — Z125 Encounter for screening for malignant neoplasm of prostate: Secondary | ICD-10-CM

## 2022-05-31 DIAGNOSIS — E1069 Type 1 diabetes mellitus with other specified complication: Secondary | ICD-10-CM

## 2022-05-31 DIAGNOSIS — G479 Sleep disorder, unspecified: Secondary | ICD-10-CM | POA: Diagnosis not present

## 2022-05-31 DIAGNOSIS — R41840 Attention and concentration deficit: Secondary | ICD-10-CM

## 2022-05-31 MED ORDER — ALPRAZOLAM 1 MG PO TABS: ORAL_TABLET | ORAL | 5 refills | Status: DC

## 2022-05-31 NOTE — Progress Notes (Signed)
MyChart Video visit  Subjective: CC: GAD w/ panic PCP: Janora Norlander, DO OIN:[facility] Ivins is a 46 y.o. male. Patient provides verbal consent for consult held via video.  Due to COVID-19 pandemic this visit was conducted virtually. This visit type was conducted due to national recommendations for restrictions regarding the COVID-19 Pandemic (e.g. social distancing, sheltering in place) in an effort to limit this patient's exposure and mitigate transmission in our community. All issues noted in this document were discussed and addressed.  A physical exam was not performed with this format.   Location of patient: car Location of provider: [facility] Others present for call: none  1. GAD with panic Patient reports that his anxiety disorder has been okay.  He still has difficulty falling asleep due to racing thoughts and he did see the specialist in [facility] for attention deficit disorder.  Preliminarily, they do think he has this but they would like him to have a sleep study done first.  There is a strong family history of obstructive sleep apnea requiring CPAP and they want to ensure that this is not something that he is dealing with  2. Dm1 with HLD Patient has had a few higher BGs 200s, this is typically when the fast acting was wearing off.  No lows.  Sugar has been well controlled.  No chest pain, shortness of breath or visual disturbance reported.  Overall he feels like blood sugars have been doing really well   ROS: Per HPI  No Known Allergies Past Medical History:  Diagnosis Date   Anxiety    Diabetes mellitus without complication ([place])     Current Outpatient Medications:    ALPRAZolam (XANAX) 1 MG tablet, 1 tab 2-3 times a day as needed for anxiety, Disp: 80 tablet, Rfl: 5   B-D INS SYRINGE 0.5CC/30GX1/2" 30G X 1/2" 0.5 ML MISC, 1 EACH BY DOES NOT APPLY ROUTE 2 (TWO) TIMES DAILY., Disp: , Rfl: 2   blood glucose meter kit and supplies KIT, Dispense based on patient and  insurance preference. Use up to four times daily as directed. (FOR ICD-9 250.00, 250.01)., Disp: 1 each, Rfl: 0   Continuous Blood Gluc Sensor (FREESTYLE LIBRE 2 SENSOR) MISC, UAD to check BGs as directed E10.69, Disp: 6 each, Rfl: 3   gabapentin (NEURONTIN) 800 MG tablet, Take 800 mg by mouth 3 (three) times daily., Disp: , Rfl:    HYDROcodone-acetaminophen (NORCO) 10-325 MG tablet, Take 1 tablet by mouth every 8 (eight) hours as needed., Disp: , Rfl:    insulin lispro (HUMALOG) 100 UNIT/ML KwikPen, INJECT 5-20 UNITS INTO THE SKIN 3 (THREE) TIMES DAILY. AS DIRECTED, Disp: 45 mL, Rfl: 0   Insulin Pen Needle 32G X 6 MM MISC, UAD with insulin E10.69, Disp: 100 each, Rfl: 3   Insulin Syringe-Needle U-100 (B-D INS SYRINGE 0.5CC/30GX1/2") 30G X 1/2" 0.5 ML MISC, 1 each by Does not apply route 2 (two) times daily., Disp: 100 each, Rfl: 2   LANTUS SOLOSTAR 100 UNIT/ML Solostar Pen, INJECT 30 UNITS INTO THE SKIN 2 (TWO) TIMES DAILY., Disp: 15 mL, Rfl: 0   morphine (MS CONTIN) 15 MG 12 hr tablet, Take 15 mg by mouth every 12 (twelve) hours., Disp: , Rfl:    NARCAN 4 MG/0.1ML LIQD nasal spray kit, 1 (ONE) SPRAY AS NEEDED, Disp: , Rfl: 3   ONETOUCH ULTRA test strip, TEST BLOOD SUGAR 5 TIMES DAILY DX E10.9, Disp: 150 strip, Rfl: 8   pravastatin (PRAVACHOL) 40 MG tablet, Take 1 tablet (  40 mg total) by mouth daily., Disp: 90 tablet, Rfl: 0   tiZANidine (ZANAFLEX) 4 MG tablet, TAKE 1/2-1 TABLET BY MOUTH EVERY 6 HOURS AS NEEDED FOR MUSCLE SPASMS., Disp: 90 tablet, Rfl: 5  Gen: Nontoxic male no acute distress sitting in his car Pulm: Breathing normally on room air. Psych: Mood stable, speech normal, affect appropriate.  Very pleasant and interactive  Assessment/ Plan: 46 y.o. male   Generalized anxiety disorder - Plan: ALPRAZolam (XANAX) 1 MG tablet  High risk medication use  Chronic use of benzodiazepine for therapeutic purpose - Plan: Ambulatory referral to Sleep Studies  Sleep difficulties - Plan:  Ambulatory referral to Sleep Studies  Difficulty concentrating - Plan: Ambulatory referral to Sleep Studies  Type 1 diabetes mellitus with other specified complication ([place]) - Plan: Ambulatory referral to Sleep Studies, Lipid panel, Bayer DCA Hb A1c Waived, CMP14+EGFR  Hyperlipidemia due to type 1 diabetes mellitus ([place]) - Plan: Ambulatory referral to Sleep Studies, CMP14+EGFR  Screening for malignant neoplasm of prostate - Plan: PSA  Appointment was conducted virtually as patient was late to his appointment but I still felt that he needed to be seen.  He will come in for fasting labs.  He is stable from an anxiety standpoint.  Currently under the care of a specialist in [place] that is evaluating him from an ADHD standpoint.  It sounds like he does have positive evaluation and they are considering starting treatment for him but they would like a sleep study first so I have placed this order to have a home sleep study done.  Medications renewed.  National narcotic database reviewed and there were no red flags.  He is up-to-date on UDS and CSC.  We will plan to repeat these at his follow-up visit in 6 months, which I personally scheduled for him  Start time: 10:41a End time: 10:51a  Total time spent on patient care (including video visit/ documentation): 10 minutes  [place], [place] [phone]

## 2022-06-05 ENCOUNTER — Encounter: Payer: Self-pay | Admitting: Family Medicine

## 2022-06-05 NOTE — Telephone Encounter (Signed)
Do you know if we have samples of this?

## 2022-06-18 ENCOUNTER — Other Ambulatory Visit: Payer: Self-pay | Admitting: Family Medicine

## 2022-06-18 DIAGNOSIS — E109 Type 1 diabetes mellitus without complications: Secondary | ICD-10-CM

## 2022-06-19 ENCOUNTER — Encounter: Payer: Self-pay | Admitting: Family Medicine

## 2022-08-02 ENCOUNTER — Other Ambulatory Visit: Payer: Self-pay | Admitting: Family Medicine

## 2022-10-14 ENCOUNTER — Other Ambulatory Visit: Payer: Self-pay | Admitting: Family Medicine

## 2022-11-25 ENCOUNTER — Encounter: Payer: Self-pay | Admitting: Family Medicine

## 2022-11-25 NOTE — Progress Notes (Deleted)
Gary Stone is a 47 y.o. male presents to office today for annual physical exam examination.    Concerns today include: 1. ***  Occupation: ***, Marital status: ***, Substance use: *** Diet: ***, Exercise: *** Last eye exam: *** Last dental exam: *** Last colonoscopy: *** Last mammogram: *** Last pap smear: *** Refills needed today: *** Immunizations needed: Immunization History  Administered Date(s) Administered   DTaP 07/20/2016   Pneumococcal Polysaccharide-23 11/03/2017   Td 07/20/2016     Past Medical History:  Diagnosis Date   Anxiety    Diabetes mellitus without complication ([facility])    Social History   Socioeconomic History   Marital status: Married    Spouse name: Not on file   Number of children: Not on file   Years of education: Not on file   Highest education level: Not on file  Occupational History   Not on file  Tobacco Use   Smoking status: Every Day    Packs/day: .25    Types: Cigarettes   Smokeless tobacco: Never  Vaping Use   Vaping Use: Never used  Substance and Sexual Activity   Alcohol use: No   Drug use: No   Sexual activity: Yes  Other Topics Concern   Not on file  Social History Narrative   Not on file   Social Determinants of Health   Financial Resource Strain: Not on file  Food Insecurity: Not on file  Transportation Needs: Not on file  Physical Activity: Not on file  Stress: Not on file  Social Connections: Not on file  Intimate Partner Violence: Not on file   Past Surgical History:  Procedure Laterality Date   ANKLE SURGERY Right    DENTAL SURGERY Bilateral    new upper, then did lower teeth removal & dentures   Family History  Problem Relation Age of Onset   Healthy Mother    Healthy Father     Current Outpatient Medications:    ALPRAZolam (XANAX) 1 MG tablet, 1 tab 2-3 times a day as needed for anxiety, Disp: 80 tablet, Rfl: 5   B-D INS SYRINGE 0.5CC/30GX1/2" 30G X 1/2" 0.5 ML MISC, 1 EACH BY DOES NOT APPLY  ROUTE 2 (TWO) TIMES DAILY., Disp: , Rfl: 2   blood glucose meter kit and supplies KIT, Dispense based on patient and insurance preference. Use up to four times daily as directed. (FOR ICD-9 250.00, 250.01)., Disp: 1 each, Rfl: 0   Continuous Blood Gluc Sensor (FREESTYLE LIBRE 2 SENSOR) MISC, UAD to check BGs as directed E10.69, Disp: 6 each, Rfl: 3   gabapentin (NEURONTIN) 800 MG tablet, Take 800 mg by mouth 3 (three) times daily., Disp: , Rfl:    HYDROcodone-acetaminophen (NORCO) 10-325 MG tablet, Take 1 tablet by mouth every 8 (eight) hours as needed., Disp: , Rfl:    insulin glargine (LANTUS SOLOSTAR) 100 UNIT/ML Solostar Pen, Inject 30 Units into the skin 2 (two) times daily., Disp: 60 mL, Rfl: PRN   insulin lispro (HUMALOG) 100 UNIT/ML KwikPen, INJECT 5-20 UNITS INTO THE SKIN 3 (THREE) TIMES DAILY. AS DIRECTED, Disp: 54 mL, Rfl: 0   Insulin Pen Needle 32G X 6 MM MISC, UAD with insulin E10.69, Disp: 100 each, Rfl: 3   Insulin Syringe-Needle U-100 (B-D INS SYRINGE 0.5CC/30GX1/2") 30G X 1/2" 0.5 ML MISC, 1 each by Does not apply route 2 (two) times daily., Disp: 100 each, Rfl: 2   morphine (MS CONTIN) 15 MG 12 hr tablet, Take 15 mg by mouth every  12 (twelve) hours., Disp: , Rfl:    NARCAN 4 MG/0.1ML LIQD nasal spray kit, 1 (ONE) SPRAY AS NEEDED, Disp: , Rfl: 3   ONETOUCH ULTRA test strip, TEST BLOOD SUGAR 5 TIMES DAILY DX E10.9, Disp: 150 strip, Rfl: 8   pravastatin (PRAVACHOL) 40 MG tablet, Take 1 tablet (40 mg total) by mouth daily., Disp: 90 tablet, Rfl: 0   tiZANidine (ZANAFLEX) 4 MG tablet, TAKE 1/2-1 TABLET BY MOUTH EVERY 6 HOURS AS NEEDED FOR MUSCLE SPASMS., Disp: 90 tablet, Rfl: 5  No Known Allergies   ROS: Review of Systems {ros; complete:30496}    Physical exam {Exam, Complete:[phone]}    Assessment/ Plan: Gary Stone here for annual physical exam.   No problem-specific Assessment & Plan notes found for this encounter.   Counseled on healthy lifestyle choices,  including diet (rich in fruits, vegetables and lean meats and low in salt and simple carbohydrates) and exercise (at least 30 minutes of moderate physical activity daily).  Patient to follow up in 1 year for annual exam or sooner if needed.  Rayshad Riviello M. Nadine Counts, DO

## 2022-11-26 ENCOUNTER — Encounter: Payer: Self-pay | Admitting: Family Medicine

## 2022-11-29 ENCOUNTER — Other Ambulatory Visit: Payer: Self-pay | Admitting: Family Medicine

## 2022-11-29 DIAGNOSIS — G8929 Other chronic pain: Secondary | ICD-10-CM

## 2022-11-29 NOTE — Telephone Encounter (Signed)
He no showed his appt.  I messaged him this on Mychart: " Margit Banda. Looks like you missed our visit today. Please call the office and make sure to reschedule this ASAP. I'm typically booking out several weeks if not months. I cannot fill your medications without a visit and we haven't seen each other in office since 10/2021."  Will send #30 days but no more refills until he is seen

## 2022-12-12 ENCOUNTER — Other Ambulatory Visit: Payer: Self-pay | Admitting: Family Medicine

## 2022-12-12 DIAGNOSIS — F411 Generalized anxiety disorder: Secondary | ICD-10-CM

## 2022-12-26 ENCOUNTER — Telehealth: Payer: Self-pay | Admitting: Family Medicine

## 2022-12-26 NOTE — Telephone Encounter (Signed)
  Prescription Request  12/26/2022  Is this a "Controlled Substance" medicine? yes  Have you seen your PCP in the last 2 weeks? No, made appt for first available on 02/19/23 - patient apologized for missing his last appt but said he had issues with his job and insurance   If YES, route message to pool  -  If NO, patient needs to be scheduled for appointment.  What is the name of the medication or equipment?  ALPRAZolam Prudy Feeler) 1 MG tablet   Have you contacted your pharmacy to request a refill? yes   Which pharmacy would you like this sent to?  [id number] - [place], [place] - [address] (Ph: [phone])     Patient notified that their request is being sent to the clinical staff for review and that they should receive a response within 2 business days.

## 2022-12-27 ENCOUNTER — Other Ambulatory Visit: Payer: Self-pay | Admitting: Family Medicine

## 2022-12-27 DIAGNOSIS — G8929 Other chronic pain: Secondary | ICD-10-CM

## 2022-12-27 NOTE — Telephone Encounter (Signed)
He no showed his last visit.  I believe Kelci tried to contact him to see what happened but never reached him.  Sadly, he cannot be worked in sooner so I would recommend he be placed on the cancellation list.

## 2022-12-30 ENCOUNTER — Encounter: Payer: Self-pay | Admitting: Family Medicine

## 2022-12-30 NOTE — Telephone Encounter (Signed)
Okay for refill or pt needs to wait for appt?

## 2023-01-01 ENCOUNTER — Encounter: Payer: Self-pay | Admitting: Family Medicine

## 2023-01-01 ENCOUNTER — Ambulatory Visit (INDEPENDENT_AMBULATORY_CARE_PROVIDER_SITE_OTHER): Payer: Self-pay | Admitting: Family Medicine

## 2023-01-01 VITALS — BP 128/75 | HR 97 | Temp 98.5°F | Ht 71.0 in | Wt 242.0 lb

## 2023-01-01 DIAGNOSIS — E785 Hyperlipidemia, unspecified: Secondary | ICD-10-CM

## 2023-01-01 DIAGNOSIS — R7309 Other abnormal glucose: Secondary | ICD-10-CM

## 2023-01-01 DIAGNOSIS — E1069 Type 1 diabetes mellitus with other specified complication: Secondary | ICD-10-CM

## 2023-01-01 DIAGNOSIS — F411 Generalized anxiety disorder: Secondary | ICD-10-CM

## 2023-01-01 DIAGNOSIS — Z79899 Other long term (current) drug therapy: Secondary | ICD-10-CM

## 2023-01-01 DIAGNOSIS — Z125 Encounter for screening for malignant neoplasm of prostate: Secondary | ICD-10-CM

## 2023-01-01 LAB — BAYER DCA HB A1C WAIVED: HB A1C (BAYER DCA - WAIVED): 8.1 % — ABNORMAL HIGH (ref 4.8–5.6)

## 2023-01-01 MED ORDER — PRAVASTATIN SODIUM 40 MG PO TABS: 40.0000 mg | ORAL_TABLET | Freq: Every day | ORAL | 3 refills | Status: DC

## 2023-01-01 MED ORDER — LANTUS SOLOSTAR 100 UNIT/ML ~~LOC~~ SOPN
30.0000 [IU] | PEN_INJECTOR | Freq: Two times a day (BID) | SUBCUTANEOUS | 99 refills | Status: DC
Start: 2023-01-01 — End: 2023-02-20

## 2023-01-01 MED ORDER — FREESTYLE LIBRE 3 SENSOR MISC
4 refills | Status: DC
Start: 2023-01-01 — End: 2023-02-20

## 2023-01-01 MED ORDER — INSULIN LISPRO (1 UNIT DIAL) 100 UNIT/ML (KWIKPEN)
5.0000 [IU] | PEN_INJECTOR | Freq: Three times a day (TID) | SUBCUTANEOUS | 3 refills | Status: DC
Start: 2023-01-01 — End: 2023-02-20

## 2023-01-01 MED ORDER — ALPRAZOLAM 1 MG PO TABS
ORAL_TABLET | ORAL | 5 refills | Status: DC
Start: 2023-01-01 — End: 2023-06-18

## 2023-01-01 MED ORDER — INSULIN PEN NEEDLE 32G X 6 MM MISC: 3 refills | Status: DC

## 2023-01-01 NOTE — Progress Notes (Signed)
Subjective: CC: GAD PCP: Raliegh Ip, DO Gary Stone is a 47 y.o. male presenting to clinic today for:  1. GAD He notes that typically he is only using 2 tablets of Xanax per day.  He is under a different job now and is much less stressful than it had been with his old job.  He apologizes profusely today for missing his appointment.  He is still being managed by [place] attention specialist for ADHD and feels like finally he is getting some stability on the current dose.  He is treated with amphetamines for ADHD.  He had sleep study done which showed no evidence of sleep apnea.  He is gradually reducing tobacco use.  He notes that he is working on weaning from some of the medications with his pain specialist and is down to 600 mg of gabapentin twice daily and Norco 10 twice daily now.  Continues to take MS Contin 15 mg twice daily as prescribed.  He denies any excessive daytime sedation, falls, respiratory depression, visual or auditory hallucinations.  2. Type 1 Diabetes with hyperlipidemia:  Glucometer:Freestyle Libre 3.   High at home: 160; Low at home: 48, Taking medication(s): Typically injecting just a little under 30 units of Lantus twice daily and uses sliding scale with Humalog before meals.  Compliant with Pravachol  Last eye exam: needs Last foot exam: needs Last A1c:  Lab Results  Component Value Date   HGBA1C 6.4 (H) 11/19/2021   Nephropathy screen indicated?: needs Last flu, zoster and/or pneumovax:  Immunization History  Administered Date(s) Administered   DTaP 07/20/2016   Pneumococcal Polysaccharide-23 11/03/2017   Td 07/20/2016    ROS: Denies dizziness, LOC, polyuria, polydipsia, unintended weight loss/gain, foot ulcerations, numbness or tingling in extremities, shortness of breath or chest pain.    ROS: Per HPI  No Known Allergies Past Medical History:  Diagnosis Date   Anxiety    Diabetes mellitus without complication (HCC)      Current Outpatient Medications:    ALPRAZolam (XANAX) 1 MG tablet, 1 tab 2-3 times a day as needed for anxiety, Disp: 80 tablet, Rfl: 5   B-D INS SYRINGE 0.5CC/30GX1/2" 30G X 1/2" 0.5 ML MISC, 1 EACH BY DOES NOT APPLY ROUTE 2 (TWO) TIMES DAILY., Disp: , Rfl: 2   blood glucose meter kit and supplies KIT, Dispense based on patient and insurance preference. Use up to four times daily as directed. (FOR ICD-9 250.00, 250.01)., Disp: 1 each, Rfl: 0   Continuous Blood Gluc Sensor (FREESTYLE LIBRE 2 SENSOR) MISC, UAD to check BGs as directed E10.69, Disp: 6 each, Rfl: 3   gabapentin (NEURONTIN) 800 MG tablet, Take 800 mg by mouth 3 (three) times daily., Disp: , Rfl:    HYDROcodone-acetaminophen (NORCO) 10-325 MG tablet, Take 1 tablet by mouth every 8 (eight) hours as needed., Disp: , Rfl:    insulin glargine (LANTUS SOLOSTAR) 100 UNIT/ML Solostar Pen, Inject 30 Units into the skin 2 (two) times daily., Disp: 60 mL, Rfl: PRN   insulin lispro (HUMALOG) 100 UNIT/ML KwikPen, INJECT 5-20 UNITS INTO THE SKIN 3 (THREE) TIMES DAILY. AS DIRECTED, Disp: 54 mL, Rfl: 0   Insulin Pen Needle 32G X 6 MM MISC, UAD with insulin E10.69, Disp: 100 each, Rfl: 3   Insulin Syringe-Needle U-100 (B-D INS SYRINGE 0.5CC/30GX1/2") 30G X 1/2" 0.5 ML MISC, 1 each by Does not apply route 2 (two) times daily., Disp: 100 each, Rfl: 2   morphine (MS CONTIN) 15 MG 12 hr  tablet, Take 15 mg by mouth every 12 (twelve) hours., Disp: , Rfl:    NARCAN 4 MG/0.1ML LIQD nasal spray kit, 1 (ONE) SPRAY AS NEEDED, Disp: , Rfl: 3   ONETOUCH ULTRA test strip, TEST BLOOD SUGAR 5 TIMES DAILY DX E10.9, Disp: 150 strip, Rfl: 8   pravastatin (PRAVACHOL) 40 MG tablet, Take 1 tablet (40 mg total) by mouth daily., Disp: 90 tablet, Rfl: 0   tiZANidine (ZANAFLEX) 4 MG tablet, TAKE 1/2-1 TABLET BY MOUTH EVERY 6 HOURS AS NEEDED FOR MUSCLE SPASMS. NEEDS APPT FOR FURTHER FILLS, Disp: 90 tablet, Rfl: 0 Social History   Socioeconomic History   Marital status:  Married    Spouse name: Not on file   Number of children: Not on file   Years of education: Not on file   Highest education level: Not on file  Occupational History   Not on file  Tobacco Use   Smoking status: Every Day    Packs/day: .25    Types: Cigarettes   Smokeless tobacco: Never  Vaping Use   Vaping Use: Never used  Substance and Sexual Activity   Alcohol use: No   Drug use: No   Sexual activity: Yes  Other Topics Concern   Not on file  Social History Narrative   Not on file   Social Determinants of Health   Financial Resource Strain: Not on file  Food Insecurity: Not on file  Transportation Needs: Not on file  Physical Activity: Not on file  Stress: Not on file  Social Connections: Not on file  Intimate Partner Violence: Not on file   Family History  Problem Relation Age of Onset   Healthy Mother    Healthy Father     Objective: Office vital signs reviewed. BP 128/75   Pulse 97   Temp 98.5 F (36.9 C)   Ht 5\' 11"  (1.803 m)   Wt 242 lb (109.8 kg)   SpO2 95%   BMI 33.75 kg/m   Physical Examination:  General: Awake, alert, well appearing male, No acute distress HEENT: sclera white, MMM Cardio: regular rate and rhythm, S1S2 heard, no murmurs appreciated Pulm: clear to auscultation bilaterally, no wheezes, rhonchi or rales; normal work of breathing on room air MSK; ambulating independently.  Normal gait and station Psych: Mood stable, speech normal, affect appropriate.  Good eye contact.  Does not appear to be responding to internal stimuli     01/01/2023    7:56 AM 11/19/2021    3:14 PM 06/19/2021    3:10 PM  Depression screen PHQ 2/9  Decreased Interest 0 0 0  Down, Depressed, Hopeless 0 0 0  PHQ - 2 Score 0 0 0  Altered sleeping 0  0  Tired, decreased energy 0  0  Change in appetite 0  0  Feeling bad or failure about yourself  0  0  Trouble concentrating 0  0  Moving slowly or fidgety/restless 0  0  Suicidal thoughts 0  0  PHQ-9 Score 0  0   Difficult doing work/chores Not difficult at all        01/01/2023    7:56 AM 11/19/2021    3:14 PM 06/19/2021    3:10 PM 02/28/2021    3:59 PM  GAD 7 : Generalized Anxiety Score  Nervous, Anxious, on Edge 0 0 1 1  Control/stop worrying 0 0 1 0  Worry too much - different things 0 1 1 0  Trouble relaxing 0 1 1 0  Restless 0 0 0 0  Easily annoyed or irritable 0 0 0 1  Afraid - awful might happen 0 0 0 0  Total GAD 7 Score 0 2 4 2   Anxiety Difficulty Not difficult at all Not difficult at all  Not difficult at all   Assessment/ Plan: 47 y.o. male   Generalized anxiety disorder - Plan: ToxASSURE Select 13 (MW), Urine, ALPRAZolam (XANAX) 1 MG tablet  High risk medication use - Plan: ToxASSURE Select 13 (MW), Urine  Chronic use of benzodiazepine for therapeutic purpose - Plan: ToxASSURE Select 13 (MW), Urine  Controlled substance agreement signed  Type 1 diabetes mellitus with other specified complication (HCC) - Plan: CMP14+EGFR, Bayer DCA Hb A1c Waived, Lipid panel, insulin lispro (HUMALOG) 100 UNIT/ML KwikPen, insulin glargine (LANTUS SOLOSTAR) 100 UNIT/ML Solostar Pen, Continuous Glucose Sensor (FREESTYLE LIBRE 3 SENSOR) MISC, Insulin Pen Needle 32G X 6 MM MISC  Hyperlipidemia due to type 1 diabetes mellitus (HCC) - Plan: CMP14+EGFR, pravastatin (PRAVACHOL) 40 MG tablet  Labile blood glucose - Plan: Continuous Glucose Sensor (FREESTYLE LIBRE 3 SENSOR) MISC  Screening for malignant neoplasm of prostate - Plan: PSA  I had a frank conversation with him today with regards to need for maintaining appointments.  He unfortunately has had late cancellations and/or missed appointments at least once per year over the last couple of years.  We discussed that this is borderline violating his controlled substance contract and I would not be amenable to prescribing any controlled substances without a visit.  Whilst I understand his situation has been complicated with a job change he really has to  take charge of making sure that he shows up for appointments appropriately.  Otherwise I will have to release him from my care.  He voiced good understanding.  UDS and CSC were updated as per office policy and the national narcotic database was reviewed.  I am aware of the Co. prescribing of opioids and amphetamines.  These prescriptions appear to be appropriate.  He is not having any red flag signs or symptoms and is fully aware of red flag signs and symptoms and risks of taking opioid with benzodiazepine.  His blood sugar is not at goal and I am quite confounded by this given his reports of average of 160 with occasional lows.  We may need to consider referral to endocrinology if he cannot get his blood sugar back under control.  I have ordered the freestyle libre 3 and sent to pharmacy.  Like to see him back in the next 3 to 4 months for blood sugar recheck, foot exam.  He is to schedule diabetic eye exam.    No orders of the defined types were placed in this encounter.  No orders of the defined types were placed in this encounter.    Raliegh Ip, DO [facility] [phone]

## 2023-01-02 LAB — LIPID PANEL
Chol/HDL Ratio: 4.3 ratio (ref 0.0–5.0)
Cholesterol, Total: 183 mg/dL (ref 100–199)
HDL: 43 mg/dL (ref >39)
LDL Chol Calc (NIH): 117 mg/dL — ABNORMAL HIGH (ref 0–99)
Triglycerides: 131 mg/dL (ref 0–149)
VLDL Cholesterol Cal: 23 mg/dL (ref 5–40)

## 2023-01-02 LAB — CMP14+EGFR
ALT: 12 IU/L (ref 0–44)
AST: 14 IU/L (ref 0–40)
Albumin/Globulin Ratio: 2 (ref 1.2–2.2)
Albumin: 4.5 g/dL (ref 4.1–5.1)
Alkaline Phosphatase: 76 IU/L (ref 44–121)
BUN/Creatinine Ratio: 14 (ref 9–20)
BUN: 16 mg/dL (ref 6–24)
Bilirubin Total: 0.5 mg/dL (ref 0.0–1.2)
CO2: 22 mmol/L (ref 20–29)
Calcium: 9.6 mg/dL (ref 8.7–10.2)
Chloride: 100 mmol/L (ref 96–106)
Creatinine, Ser: 1.12 mg/dL (ref 0.76–1.27)
Globulin, Total: 2.3 g/dL (ref 1.5–4.5)
Glucose: 120 mg/dL — ABNORMAL HIGH (ref 70–99)
Potassium: 4.2 mmol/L (ref 3.5–5.2)
Sodium: 138 mmol/L (ref 134–144)
Total Protein: 6.8 g/dL (ref 6.0–8.5)
eGFR: 82 mL/min/{1.73_m2} (ref >59)

## 2023-01-02 LAB — PSA: Prostate Specific Ag, Serum: 0.2 ng/mL (ref 0.0–4.0)

## 2023-01-04 LAB — TOXASSURE SELECT 13 (MW), URINE

## 2023-01-19 ENCOUNTER — Other Ambulatory Visit: Payer: Self-pay | Admitting: Family Medicine

## 2023-01-19 DIAGNOSIS — G8929 Other chronic pain: Secondary | ICD-10-CM

## 2023-02-11 ENCOUNTER — Other Ambulatory Visit: Payer: Self-pay | Admitting: Family Medicine

## 2023-02-11 NOTE — Telephone Encounter (Signed)
He has a specialist for this. This is not my rx.

## 2023-02-19 ENCOUNTER — Encounter: Payer: Self-pay | Admitting: Family Medicine

## 2023-02-19 ENCOUNTER — Ambulatory Visit: Payer: Self-pay | Admitting: Family Medicine

## 2023-02-19 DIAGNOSIS — E1069 Type 1 diabetes mellitus with other specified complication: Secondary | ICD-10-CM

## 2023-02-19 NOTE — Telephone Encounter (Signed)
Gary Stone, Can you please help with this?? I'm fine with alternative basal and novolog but when I go to change it still prompts like PA might be required?  Just want to make sure he gets what he needs.  He's a Type 1

## 2023-02-20 ENCOUNTER — Telehealth: Payer: Self-pay | Admitting: Pharmacist

## 2023-02-20 ENCOUNTER — Other Ambulatory Visit: Payer: Self-pay | Admitting: Pharmacist

## 2023-02-20 DIAGNOSIS — E109 Type 1 diabetes mellitus without complications: Secondary | ICD-10-CM

## 2023-02-20 DIAGNOSIS — R7309 Other abnormal glucose: Secondary | ICD-10-CM

## 2023-02-20 DIAGNOSIS — E1069 Type 1 diabetes mellitus with other specified complication: Secondary | ICD-10-CM

## 2023-02-20 MED ORDER — INSULIN DEGLUDEC 100 UNIT/ML ~~LOC~~ SOPN
30.0000 [IU] | PEN_INJECTOR | Freq: Two times a day (BID) | SUBCUTANEOUS | 5 refills | Status: DC
Start: 2023-02-20 — End: 2023-02-27

## 2023-02-20 MED ORDER — FREESTYLE LIBRE 3 SENSOR MISC
4 refills | Status: DC
Start: 2023-02-20 — End: 2023-04-14

## 2023-02-20 MED ORDER — NOVOLOG FLEXPEN 100 UNIT/ML ~~LOC~~ SOPN
5.0000 [IU] | PEN_INJECTOR | Freq: Three times a day (TID) | SUBCUTANEOUS | 11 refills | Status: DC
Start: 2023-02-20 — End: 2023-12-24

## 2023-02-20 MED ORDER — INSULIN LISPRO (1 UNIT DIAL) 100 UNIT/ML (KWIKPEN)
5.0000 [IU] | PEN_INJECTOR | Freq: Three times a day (TID) | SUBCUTANEOUS | 3 refills | Status: DC
Start: 2023-02-20 — End: 2023-02-20

## 2023-02-20 MED ORDER — LANTUS SOLOSTAR 100 UNIT/ML ~~LOC~~ SOPN: 30.0000 [IU] | PEN_INJECTOR | Freq: Two times a day (BID) | SUBCUTANEOUS | 99 refills | Status: DC

## 2023-02-20 NOTE — Progress Notes (Signed)
   02/20/2023 Name: Gary Stone MRN: [id number] DOB: 07/26/1976  Chief Complaint  Patient presents with   Diabetes   Lantus and Humalog pens appear to be covered (preferred) under formulary when ordered in EPIC.  Refills sent it for 3 month supplies.  Libre 3 CGM sensors sent in.  Will likely need a PA (await PA request).   [name], PharmD, BCACP Clinical Pharmacist, [facility]

## 2023-02-20 NOTE — Telephone Encounter (Signed)
Insurance actually prefers [place] and [employer] and sent to PCP to [employer] patient to let him know of transition

## 2023-02-23 ENCOUNTER — Other Ambulatory Visit: Payer: Self-pay | Admitting: Family Medicine

## 2023-02-23 DIAGNOSIS — E109 Type 1 diabetes mellitus without complications: Secondary | ICD-10-CM

## 2023-02-24 NOTE — Telephone Encounter (Signed)
I can place [phone] but can you help?  I thought there was a $35 cap. Didn't see any samples in fridge.

## 2023-02-24 NOTE — Telephone Encounter (Signed)
TRESIBA FLEXTOUCH 100 UNIT/ML FlexTouch Pen  Pharmacy comment: Alternative Requested:MEDICINE IS STILL TOO EXPENSIVE FOR THE PT. HE STATES HE NEEDS AN ALTERNATIVE. THANKS!

## 2023-02-26 ENCOUNTER — Encounter: Payer: Self-pay | Admitting: Pharmacist

## 2023-02-26 ENCOUNTER — Other Ambulatory Visit: Payer: Self-pay | Admitting: Pharmacist

## 2023-02-27 ENCOUNTER — Other Ambulatory Visit: Payer: Self-pay | Admitting: Pharmacist

## 2023-02-27 DIAGNOSIS — E109 Type 1 diabetes mellitus without complications: Secondary | ICD-10-CM

## 2023-02-27 MED ORDER — INSULIN DEGLUDEC 100 UNIT/ML ~~LOC~~ SOPN: 30.0000 [IU] | PEN_INJECTOR | Freq: Two times a day (BID) | SUBCUTANEOUS | 5 refills | Status: DC

## 2023-02-27 NOTE — Progress Notes (Signed)
   02/27/2023 Name: Aerik Bandura MRN: [id number] DOB: 02-10-1976  [name] preferred on insurance plan ($100 copay)--instructed patient to get copay card and sent new 30day RX to the pharmacy.  Continue Novolog with meal times ($35 copay card available online)  Will notify patient.  Continue current dosing.   [name], PharmD, BCACP Clinical Pharmacist, [facility]

## 2023-03-04 NOTE — Progress Notes (Signed)
    02/26/2023 Name: Gary Stone  MRN: [id number]       DOB: 1976/02/24   It appears [name] is preferred on patient's current insurance plan (~$100 copay per patient )--instructed patient to get copay card and sent new 30day [name] RX to the pharmacy.  Continue Novolog with meal times ($35 copay card available online as well)  Will notify patient--sent my chart message and left VM.  Continue current dosing. Called [facility] pharmacy: Novolog was filled on 02/23/23 [name] was filled but has not been picked up $160 copay   [name], PharmD, BCACP Clinical Pharmacist, [facility]

## 2023-03-07 ENCOUNTER — Other Ambulatory Visit: Payer: Self-pay | Admitting: Family Medicine

## 2023-04-11 ENCOUNTER — Other Ambulatory Visit: Payer: Self-pay | Admitting: Family Medicine

## 2023-04-11 DIAGNOSIS — R7309 Other abnormal glucose: Secondary | ICD-10-CM

## 2023-04-11 DIAGNOSIS — E1069 Type 1 diabetes mellitus with other specified complication: Secondary | ICD-10-CM

## 2023-04-14 NOTE — Telephone Encounter (Signed)
  Name from pharmacy: FREESTYLE LIBRE 3 SENSOR   Pharmacy comment: Alternative Requested:DEXCOM PREFERRED BY INSURANCE.

## 2023-04-16 ENCOUNTER — Telehealth: Payer: Self-pay | Admitting: Family Medicine

## 2023-06-02 ENCOUNTER — Telehealth: Payer: Self-pay | Admitting: *Deleted

## 2023-06-02 MED ORDER — DEXCOM G6 TRANSMITTER MISC: 0 refills | Status: DC

## 2023-06-02 NOTE — Telephone Encounter (Signed)
Needs transmiter to [facility] sensors

## 2023-06-05 ENCOUNTER — Telehealth: Payer: Self-pay | Admitting: Family Medicine

## 2023-06-18 ENCOUNTER — Ambulatory Visit: Payer: No Typology Code available for payment source | Admitting: Family Medicine

## 2023-06-18 ENCOUNTER — Encounter: Payer: Self-pay | Admitting: Family Medicine

## 2023-06-18 VITALS — BP 133/82 | HR 95 | Temp 98.4°F | Resp 20 | Ht 71.0 in | Wt 226.4 lb

## 2023-06-18 DIAGNOSIS — Z79899 Other long term (current) drug therapy: Secondary | ICD-10-CM | POA: Diagnosis not present

## 2023-06-18 DIAGNOSIS — E785 Hyperlipidemia, unspecified: Secondary | ICD-10-CM

## 2023-06-18 DIAGNOSIS — F411 Generalized anxiety disorder: Secondary | ICD-10-CM | POA: Diagnosis not present

## 2023-06-18 DIAGNOSIS — E1069 Type 1 diabetes mellitus with other specified complication: Secondary | ICD-10-CM | POA: Diagnosis not present

## 2023-06-18 MED ORDER — ALPRAZOLAM 1 MG PO TABS: 1.0000 mg | ORAL_TABLET | Freq: Two times a day (BID) | ORAL | 5 refills | Status: DC | PRN

## 2023-06-18 NOTE — Progress Notes (Signed)
Subjective: CC:DM PCP: Raliegh Ip, DO EXB:[id number] Gary Stone is a 47 y.o. male presenting to clinic today for:  1. Type 1 Diabetes with hyperlipidemia:  Reports occasional hypoglycemic episodes.  She has had a few down into the 50s but these always occur at work when he is being physically active.  No overnight low blood sugars.  Continues to use a Dexcom for continuous glucose monitoring.  Compliant with insulins.  Diabetes Health Maintenance Due  Topic Date Due   OPHTHALMOLOGY EXAM  Never done   FOOT EXAM  07/05/2021   HEMOGLOBIN A1C  07/03/2023    Last A1c:  Lab Results  Component Value Date   HGBA1C 8.1 (H) 01/01/2023    ROS: Denies dizziness, LOC, polyuria, polydipsia, unintended weight loss/gain, foot ulcerations, numbness or tingling in extremities, shortness of breath or chest pain.  2.  Anxiety disorder Continues to use alprazolam up to twice daily with rare third dosing.  His pain specialist requested that he go to as low-dose as possible and so he is requesting to be prescribed only twice daily Xanax now.  Denies any changes in memory, problems with excessive daytime sedation, respiratory depression given concomitant use of Norco  ROS: Per HPI  No Known Allergies Past Medical History:  Diagnosis Date   Anxiety    Diabetes mellitus without complication (HCC)     Current Outpatient Medications:    ALPRAZolam (XANAX) 1 MG tablet, 1 tab 2-3 times a day as needed for anxiety, Disp: 80 tablet, Rfl: 5   amphetamine-dextroamphetamine (ADDERALL XR) 10 MG 24 hr capsule, Take 10 mg by mouth daily., Disp: , Rfl:    B-D INS SYRINGE 0.5CC/30GX1/2" 30G X 1/2" 0.5 ML MISC, 1 EACH BY DOES NOT APPLY ROUTE 2 (TWO) TIMES DAILY., Disp: , Rfl: 2   blood glucose meter kit and supplies KIT, Dispense based on patient and insurance preference. Use up to four times daily as directed. (FOR ICD-9 250.00, 250.01)., Disp: 1 each, Rfl: 0   Continuous Glucose Sensor (DEXCOM G6 SENSOR)  MISC, Use to check Blood glucose up to 4 times daily, Disp: 90 each, Rfl: 9   Continuous Glucose Transmitter (DEXCOM G6 TRANSMITTER) MISC, Use w/ Dexcom sensors, Disp: 1 each, Rfl: 0   gabapentin (NEURONTIN) 800 MG tablet, Take 800 mg by mouth 3 (three) times daily., Disp: , Rfl:    glucose blood (ONETOUCH ULTRA TEST) test strip, TEST BLOOD SUGAR 5 TIMES DAILY DX E10.9, Disp: 500 strip, Rfl: 3   HYDROcodone-acetaminophen (NORCO) 10-325 MG tablet, Take 1 tablet by mouth every 8 (eight) hours as needed., Disp: , Rfl:    insulin aspart (NOVOLOG FLEXPEN) 100 UNIT/ML FlexPen, Inject 5-20 Units into the skin 3 (three) times daily with meals., Disp: 45 mL, Rfl: 11   insulin degludec (TRESIBA) 100 UNIT/ML FlexTouch Pen, Inject 30 Units into the skin 2 (two) times daily., Disp: 15 mL, Rfl: 5   Insulin Pen Needle 32G X 6 MM MISC, UAD with insulin E10.69, Disp: 100 each, Rfl: 3   Insulin Syringe-Needle U-100 (B-D INS SYRINGE 0.5CC/30GX1/2") 30G X 1/2" 0.5 ML MISC, 1 each by Does not apply route 2 (two) times daily., Disp: 100 each, Rfl: 2   morphine (MS CONTIN) 15 MG 12 hr tablet, Take 15 mg by mouth every 12 (twelve) hours., Disp: , Rfl:    NARCAN 4 MG/0.1ML LIQD nasal spray kit, 1 (ONE) SPRAY AS NEEDED, Disp: , Rfl: 3   pravastatin (PRAVACHOL) 40 MG tablet, Take 1 tablet (40  mg total) by mouth daily., Disp: 90 tablet, Rfl: 3 Social History   Socioeconomic History   Marital status: Married    Spouse name: Not on file   Number of children: Not on file   Years of education: Not on file   Highest education level: Not on file  Occupational History   Not on file  Tobacco Use   Smoking status: Every Day    Current packs/day: 0.25    Types: Cigarettes   Smokeless tobacco: Never  Vaping Use   Vaping status: Never Used  Substance and Sexual Activity   Alcohol use: No   Drug use: No   Sexual activity: Yes  Other Topics Concern   Not on file  Social History Narrative   Not on file   Social  Determinants of Health   Financial Resource Strain: Not on file  Food Insecurity: Not on file  Transportation Needs: Not on file  Physical Activity: Not on file  Stress: Not on file  Social Connections: Not on file  Intimate Partner Violence: Not on file   Family History  Problem Relation Age of Onset   Healthy Mother    Healthy Father     Objective: Office vital signs reviewed. BP 133/82   Pulse 95   Temp 98.4 F (36.9 C) (Oral)   Resp 20   Ht 5\' 11"  (1.803 m)   Wt 226 lb 6 oz (102.7 kg)   SpO2 98%   BMI 31.57 kg/m   Physical Examination:  General: Awake, alert, well nourished, No acute distress HEENT: sclera white, MMM Cardio: regular rate and rhythm, S1S2 heard, no murmurs appreciated Pulm: clear to auscultation bilaterally, no wheezes, rhonchi or rales; normal work of breathing on room air      06/18/2023    4:11 PM 06/18/2023    4:10 PM 01/01/2023    7:56 AM  Depression screen PHQ 2/9  Decreased Interest 0 0 0  Down, Depressed, Hopeless 0 0 0  PHQ - 2 Score 0 0 0  Altered sleeping 0 0 0  Tired, decreased energy 0 0 0  Change in appetite 0 0 0  Feeling bad or failure about yourself  0 0 0  Trouble concentrating 0 0 0  Moving slowly or fidgety/restless 0 0 0  Suicidal thoughts 0 0 0  PHQ-9 Score 0 0 0  Difficult doing work/chores  Not difficult at all Not difficult at all      06/18/2023    4:11 PM 06/18/2023    4:10 PM 01/01/2023    7:56 AM 11/19/2021    3:14 PM  GAD 7 : Generalized Anxiety Score  Nervous, Anxious, on Edge 0 0 0 0  Control/stop worrying 0 0 0 0  Worry too much - different things 0 0 0 1  Trouble relaxing 0 0 0 1  Restless 0 0 0 0  Easily annoyed or irritable 0 0 0 0  Afraid - awful might happen 0 0 0 0  Total GAD 7 Score 0 0 0 2  Anxiety Difficulty  Not difficult at all Not difficult at all Not difficult at all   Assessment/ Plan: 47 y.o. male   Type 1 diabetes mellitus with other specified complication (HCC) - Plan:  Microalbumin / creatinine urine ratio, Bayer DCA Hb A1c Waived  Hyperlipidemia due to type 1 diabetes mellitus (HCC)  Generalized anxiety disorder - Plan: ALPRAZolam (XANAX) 1 MG tablet  High risk medication use  Chronic use of benzodiazepine for  therapeutic purpose - Plan: ToxASSURE Select 13 (MW), Urine  Sugar still technically out of range with A1c of 7.2 but much better than previous checkup.  No changes.  Check urine microalbumin.  He will schedule diabetic eye exam  Reiterated need for resumption of statin given type 1 diabetes status  Alprazolam renewed.  UDS and CSA were updated as per office policy.  National narcotic database reviewed and there were no red flags  Raliegh Ip, DO [facility] [phone]

## 2023-06-19 LAB — MICROALBUMIN / CREATININE URINE RATIO
Creatinine, Urine: 389.1 mg/dL
Microalb/Creat Ratio: 4 mg/g{creat} (ref 0–29)
Microalbumin, Urine: 15.4 ug/mL

## 2023-06-19 LAB — BAYER DCA HB A1C WAIVED: HB A1C (BAYER DCA - WAIVED): 7.2 % — ABNORMAL HIGH (ref 4.8–5.6)

## 2023-06-21 LAB — TOXASSURE SELECT 13 (MW), URINE

## 2023-08-24 ENCOUNTER — Other Ambulatory Visit: Payer: Self-pay | Admitting: Family Medicine

## 2023-08-24 DIAGNOSIS — E109 Type 1 diabetes mellitus without complications: Secondary | ICD-10-CM

## 2023-10-17 ENCOUNTER — Other Ambulatory Visit: Payer: Self-pay | Admitting: Family Medicine

## 2023-10-17 DIAGNOSIS — E109 Type 1 diabetes mellitus without complications: Secondary | ICD-10-CM

## 2023-12-09 ENCOUNTER — Other Ambulatory Visit: Payer: Self-pay | Admitting: Family Medicine

## 2023-12-09 DIAGNOSIS — E109 Type 1 diabetes mellitus without complications: Secondary | ICD-10-CM

## 2023-12-23 ENCOUNTER — Other Ambulatory Visit: Payer: Self-pay | Admitting: Family Medicine

## 2023-12-23 DIAGNOSIS — F411 Generalized anxiety disorder: Secondary | ICD-10-CM

## 2023-12-24 ENCOUNTER — Ambulatory Visit (INDEPENDENT_AMBULATORY_CARE_PROVIDER_SITE_OTHER): Payer: No Typology Code available for payment source | Admitting: Family Medicine

## 2023-12-24 ENCOUNTER — Encounter: Payer: Self-pay | Admitting: Family Medicine

## 2023-12-24 VITALS — BP 137/78 | HR 97 | Temp 98.7°F | Ht 71.0 in | Wt 234.0 lb

## 2023-12-24 DIAGNOSIS — Z0000 Encounter for general adult medical examination without abnormal findings: Secondary | ICD-10-CM

## 2023-12-24 DIAGNOSIS — Z5320 Procedure and treatment not carried out because of patient's decision for unspecified reasons: Secondary | ICD-10-CM | POA: Diagnosis not present

## 2023-12-24 DIAGNOSIS — Z0001 Encounter for general adult medical examination with abnormal findings: Secondary | ICD-10-CM | POA: Diagnosis not present

## 2023-12-24 DIAGNOSIS — Z125 Encounter for screening for malignant neoplasm of prostate: Secondary | ICD-10-CM | POA: Diagnosis not present

## 2023-12-24 DIAGNOSIS — F411 Generalized anxiety disorder: Secondary | ICD-10-CM

## 2023-12-24 DIAGNOSIS — E1069 Type 1 diabetes mellitus with other specified complication: Secondary | ICD-10-CM

## 2023-12-24 DIAGNOSIS — E785 Hyperlipidemia, unspecified: Secondary | ICD-10-CM

## 2023-12-24 DIAGNOSIS — Z23 Encounter for immunization: Secondary | ICD-10-CM

## 2023-12-24 DIAGNOSIS — Z79899 Other long term (current) drug therapy: Secondary | ICD-10-CM

## 2023-12-24 DIAGNOSIS — Z1159 Encounter for screening for other viral diseases: Secondary | ICD-10-CM

## 2023-12-24 DIAGNOSIS — Z114 Encounter for screening for human immunodeficiency virus [HIV]: Secondary | ICD-10-CM | POA: Diagnosis not present

## 2023-12-24 DIAGNOSIS — Z1211 Encounter for screening for malignant neoplasm of colon: Secondary | ICD-10-CM

## 2023-12-24 MED ORDER — INSULIN DEGLUDEC FLEXTOUCH 100 UNIT/ML ~~LOC~~ SOPN: 30.0000 [IU] | PEN_INJECTOR | Freq: Two times a day (BID) | SUBCUTANEOUS | 0 refills | Status: DC

## 2023-12-24 MED ORDER — NOVOLOG FLEXPEN 100 UNIT/ML ~~LOC~~ SOPN
5.0000 [IU] | PEN_INJECTOR | Freq: Three times a day (TID) | SUBCUTANEOUS | 11 refills | Status: AC
Start: 2023-12-24 — End: ?

## 2023-12-24 MED ORDER — ALPRAZOLAM 1 MG PO TABS: 1.0000 mg | ORAL_TABLET | Freq: Two times a day (BID) | ORAL | 5 refills | Status: DC | PRN

## 2023-12-24 MED ORDER — PRAVASTATIN SODIUM 40 MG PO TABS: 40.0000 mg | ORAL_TABLET | Freq: Every day | ORAL | 3 refills | Status: DC

## 2023-12-24 MED ORDER — INSULIN PEN NEEDLE 32G X 6 MM MISC: 3 refills | Status: AC

## 2023-12-24 MED ORDER — INSULIN SYRINGE-NEEDLE U-100 30G X 1/2" 0.5 ML MISC: 1.0000 | Freq: Two times a day (BID) | 2 refills | Status: AC

## 2023-12-24 NOTE — Progress Notes (Signed)
 Gary Stone is a 48 y.o. male presents to office today for annual physical exam examination.    Concerns today include: 1. Type 1 Diabetes with hyperlipidemia:  Livalo is using the Dexcom but it was pretty aggravating to you so he discontinued it is back on fingerstick.  Does report a recent blood sugar of 56 but he had overused his insulin  that time.  He also had a high of 312 but had eaten some ice cream the night before.  Last eye exam: needs Last foot exam: needs Last A1c:  Lab Results  Component Value Date   HGBA1C 7.2 (H) 06/18/2023   Nephropathy screen indicated?: UTD Last flu, zoster and/or pneumovax:  Immunization History  Administered Date(s) Administered   DTaP 07/20/2016   Pneumococcal Polysaccharide-23 11/03/2017   Td 07/20/2016    ROS: Denies dizziness, LOC, polyuria, polydipsia, unintended weight loss/gain, foot ulcerations, numbness or tingling in extremities, shortness of breath or chest pain.  2.  Panic attacks He continues to use alprazolam  up to twice daily as needed (this is down from 3 times daily).  Concomitantly treated with gabapentin, Norco, MS Contin and Adderall by other specialists.  Denies any excessive daytime sedation, falls, respiratory depression, dizziness, memory changes   Occupation: Works in a shop, Marital status: Married  Health Maintenance Due  Topic Date Due   FOOT EXAM  07/05/2021   HEMOGLOBIN A1C  12/16/2023   Diabetic kidney evaluation - eGFR measurement  01/01/2024   Refills needed today: All  Immunization History  Administered Date(s) Administered   DTaP 07/20/2016   Pneumococcal Polysaccharide-23 11/03/2017   Td 07/20/2016   Past Medical History:  Diagnosis Date   Anxiety    Diabetes mellitus without complication (HCC)    Social History   Socioeconomic History   Marital status: Married    Spouse name: Not on file   Number of children: Not on file   Years of education: Not on file   Highest education level:  Not on file  Occupational History   Not on file  Tobacco Use   Smoking status: Every Day    Current packs/day: 0.25    Types: Cigarettes   Smokeless tobacco: Never  Vaping Use   Vaping status: Never Used  Substance and Sexual Activity   Alcohol use: No   Drug use: No   Sexual activity: Yes  Other Topics Concern   Not on file  Social History Narrative   Not on file   Social Drivers of Health   Financial Resource Strain: Not on file  Food Insecurity: Not on file  Transportation Needs: Not on file  Physical Activity: Not on file  Stress: Not on file  Social Connections: Not on file  Intimate Partner Violence: Not on file   Past Surgical History:  Procedure Laterality Date   ANKLE SURGERY Right    DENTAL SURGERY Bilateral    new upper, then did lower teeth removal & dentures   Family History  Problem Relation Age of Onset   Healthy Mother    Healthy Father     Current Outpatient Medications:    ALPRAZolam  (XANAX ) 1 MG tablet, Take 1 tablet (1 mg total) by mouth 2 (two) times daily as needed for anxiety., Disp: 60 tablet, Rfl: 5   amphetamine-dextroamphetamine (ADDERALL XR) 10 MG 24 hr capsule, Take 10 mg by mouth daily., Disp: , Rfl:    B-D INS SYRINGE 0.5CC/30GX1/2" 30G X 1/2" 0.5 ML MISC, 1 EACH BY DOES NOT APPLY ROUTE  2 (TWO) TIMES DAILY., Disp: , Rfl: 2   blood glucose meter kit and supplies KIT, Dispense based on patient and insurance preference. Use up to four times daily as directed. (FOR ICD-9 250.00, 250.01)., Disp: 1 each, Rfl: 0   Continuous Glucose Sensor (DEXCOM G6 SENSOR) MISC, Use to check Blood glucose up to 4 times daily, Disp: 90 each, Rfl: 9   Continuous Glucose Transmitter (DEXCOM G6 TRANSMITTER) MISC, Use w/ Dexcom sensors, Disp: 1 each, Rfl: 0   gabapentin (NEURONTIN) 800 MG tablet, Take 800 mg by mouth 3 (three) times daily., Disp: , Rfl:    glucose blood (ONETOUCH ULTRA TEST) test strip, TEST BLOOD SUGAR 5 TIMES DAILY DX E10.9, Disp: 500 strip,  Rfl: 3   HYDROcodone-acetaminophen (NORCO) 10-325 MG tablet, Take 1 tablet by mouth every 8 (eight) hours as needed., Disp: , Rfl:    insulin  aspart (NOVOLOG  FLEXPEN) 100 UNIT/ML FlexPen, Inject 5-20 Units into the skin 3 (three) times daily with meals., Disp: 45 mL, Rfl: 11   Insulin  Degludec FlexTouch 100 UNIT/ML SOPN, Inject 30 Units into the skin 2 (two) times daily., Disp: 15 mL, Rfl: 0   Insulin  Pen Needle 32G X 6 MM MISC, UAD with insulin  E10.69, Disp: 100 each, Rfl: 3   Insulin  Syringe-Needle U-100 (B-D INS SYRINGE 0.5CC/30GX1/2") 30G X 1/2" 0.5 ML MISC, 1 each by Does not apply route 2 (two) times daily., Disp: 100 each, Rfl: 2   morphine (MS CONTIN) 15 MG 12 hr tablet, Take 15 mg by mouth every 12 (twelve) hours., Disp: , Rfl:    NARCAN 4 MG/0.1ML LIQD nasal spray kit, 1 (ONE) SPRAY AS NEEDED, Disp: , Rfl: 3  No Known Allergies   ROS: Review of Systems A comprehensive review of systems was negative except for: Neurological: positive for 1 episode of lightheadedness that resolved after a few seconds. Not related to BGs    Physical exam BP 137/78   Pulse 97   Temp 98.7 F (37.1 C)   Ht 5\' 11"  (1.803 m)   Wt 234 lb (106.1 kg)   SpO2 98%   BMI 32.64 kg/m  General appearance: alert, cooperative, appears stated age, no distress, and mildly obese Head: Normocephalic, without obvious abnormality, atraumatic Eyes: negative findings: lids and lashes normal, conjunctivae and sclerae normal, corneas clear, and pupils equal, round, reactive to light and accomodation Ears: normal TM's and external ear canals both ears Nose: Nares normal. Septum midline. Mucosa normal. No drainage or sinus tenderness. Throat: lips, mucosa, and tongue normal; teeth and gums normal Neck: no adenopathy, supple, symmetrical, trachea midline, and thyroid not enlarged, symmetric, no tenderness/mass/nodules Back: symmetric, no curvature. ROM normal. No CVA tenderness. Lungs: clear to auscultation  bilaterally Heart: regular rate and rhythm, S1, S2 normal, no murmur, click, rub or gallop Abdomen: soft, non-tender; bowel sounds normal; no masses,  no organomegaly and obese Extremities: extremities normal, atraumatic, no cyanosis or edema Pulses: 2+ and symmetric Skin: Skin color, texture, turgor normal. No rashes or lesions Lymph nodes: Cervical, supraclavicular, and axillary nodes normal. Neurologic: Grossly normal      12/24/2023    3:30 PM 06/18/2023    4:11 PM 06/18/2023    4:10 PM  Depression screen PHQ 2/9  Decreased Interest 0 0 0  Down, Depressed, Hopeless 0 0 0  PHQ - 2 Score 0 0 0  Altered sleeping 0 0 0  Tired, decreased energy 0 0 0  Change in appetite 0 0 0  Feeling bad or failure  about yourself  0 0 0  Trouble concentrating 0 0 0  Moving slowly or fidgety/restless 0 0 0  Suicidal thoughts 0 0 0  PHQ-9 Score 0 0 0  Difficult doing work/chores Not difficult at all  Not difficult at all      12/24/2023    3:30 PM 06/18/2023    4:11 PM 06/18/2023    4:10 PM 01/01/2023    7:56 AM  GAD 7 : Generalized Anxiety Score  Nervous, Anxious, on Edge 0 0 0 0  Control/stop worrying 0 0 0 0  Worry too much - different things 0 0 0 0  Trouble relaxing 0 0 0 0  Restless 0 0 0 0  Easily annoyed or irritable 0 0 0 0  Afraid - awful might happen 0 0 0 0  Total GAD 7 Score 0 0 0 0  Anxiety Difficulty Not difficult at all  Not difficult at all Not difficult at all   Diabetic Foot Exam - Simple   Simple Foot Form Diabetic Foot exam was performed with the following findings: Yes 12/24/2023  4:07 PM  Visual Inspection No deformities, no ulcerations, no other skin breakdown bilaterally: Yes Sensation Testing Intact to touch and monofilament testing bilaterally: Yes Pulse Check Posterior Tibialis and Dorsalis pulse intact bilaterally: Yes Comments      Assessment/ Plan: Almarie Jacob here for annual physical exam.   Annual physical exam  Colon cancer screening  declined  Screening for malignant neoplasm of prostate - Plan: PSA, CANCELED: PSA  Encounter for screening for HIV - Plan: HIV Antibody (routine testing w rflx), CANCELED: HIV Antibody (routine testing w rflx)  Need for hepatitis C screening test - Plan: Hepatitis C antibody, CANCELED: Hepatitis C antibody  Type 1 diabetes mellitus with other specified complication (HCC) - Plan: Bayer DCA Hb A1c Waived, CBC with Differential, CMP14+EGFR, Insulin  Syringe-Needle U-100 (B-D INS SYRINGE 0.5CC/30GX1/2") 30G X 1/2" 0.5 ML MISC, Insulin  Pen Needle 32G X 6 MM MISC, insulin  aspart (NOVOLOG  FLEXPEN) 100 UNIT/ML FlexPen, Insulin  Degludec FlexTouch 100 UNIT/ML SOPN, CANCELED: Bayer DCA Hb A1c Waived, CANCELED: CMP14+EGFR, CANCELED: CBC with Differential  Hyperlipidemia due to type 1 diabetes mellitus (HCC) - Plan: CMP14+EGFR, Lipid Panel, pravastatin  (PRAVACHOL ) 40 MG tablet, CANCELED: Lipid Panel, CANCELED: CMP14+EGFR  Generalized anxiety disorder - Plan: ToxASSURE Select 13 (MW), Urine, ALPRAZolam  (XANAX ) 1 MG tablet  Controlled substance agreement signed - Plan: ToxASSURE Select 13 (MW), Urine  Encounter for Prevnar pneumococcal vaccination - Plan: Pneumococcal conjugate vaccine 20-valent (Prevnar 20)  He will come in for fasting labs and appointment has been made for later this week.  Pneumococcal vaccination administered.  Sugars probably unchanged based on what he described today.  I have renewed his diabetic supplies.  Diabetic eye exam scheduled.  Diabetic foot exam performed  Pravachol  renewed.  Fasting lipid panel ordered  UDS and CSA are up-to-date as per office policy and the national narcotic database reviewed.  He is on high risk combination of medication but has been on this for years and has not had any complications up into this point.  He understands the risk of Co. prescription of opioids and benzodiazepine.  Counseled on healthy lifestyle choices, including diet (rich in fruits,  vegetables and lean meats and low in salt and simple carbohydrates) and exercise (at least 30 minutes of moderate physical activity daily).  Patient to follow up 88m  Ashelynn Marks M. Bonnell Butcher, DO

## 2023-12-24 NOTE — Patient Instructions (Signed)

## 2023-12-25 ENCOUNTER — Telehealth: Payer: Self-pay

## 2023-12-25 NOTE — Telephone Encounter (Signed)
 Copied from CRM [id number]. Topic: Clinical - Prescription Issue >> Dec 24, 2023  5:33 PM Tiffany H wrote: Reason for CRM: [facility] called to advise that patient is taking Morphine, Hydrocodone, Adderrall and Alprazolam . Please assist.

## 2023-12-26 NOTE — Telephone Encounter (Signed)
 Yes, I am aware he is being co-prescribed these meds.  He sees an ADHD MD as well as a pain specialist.  We have already begun reducing xanax  for him.

## 2023-12-26 NOTE — Telephone Encounter (Signed)
 Pharmacy at [facility] made aware of provider feedback and voiced understanding.

## 2023-12-27 LAB — TOXASSURE SELECT 13 (MW), URINE

## 2023-12-29 ENCOUNTER — Ambulatory Visit: Payer: Self-pay | Admitting: Family Medicine

## 2023-12-31 ENCOUNTER — Other Ambulatory Visit

## 2023-12-31 ENCOUNTER — Ambulatory Visit (INDEPENDENT_AMBULATORY_CARE_PROVIDER_SITE_OTHER)

## 2023-12-31 DIAGNOSIS — Z125 Encounter for screening for malignant neoplasm of prostate: Secondary | ICD-10-CM

## 2023-12-31 DIAGNOSIS — E1069 Type 1 diabetes mellitus with other specified complication: Secondary | ICD-10-CM

## 2023-12-31 DIAGNOSIS — Z1159 Encounter for screening for other viral diseases: Secondary | ICD-10-CM

## 2023-12-31 DIAGNOSIS — E109 Type 1 diabetes mellitus without complications: Secondary | ICD-10-CM | POA: Diagnosis not present

## 2023-12-31 DIAGNOSIS — Z114 Encounter for screening for human immunodeficiency virus [HIV]: Secondary | ICD-10-CM

## 2023-12-31 LAB — BAYER DCA HB A1C WAIVED: HB A1C (BAYER DCA - WAIVED): 7.1 % — ABNORMAL HIGH (ref 4.8–5.6)

## 2023-12-31 LAB — HM DIABETES EYE EXAM

## 2023-12-31 NOTE — Progress Notes (Signed)
 Gary Stone arrived 12/31/2023 and has given verbal consent to obtain images and complete their overdue diabetic retinal screening.  The images have been sent to an ophthalmologist or optometrist for review and interpretation.  Results will be sent back to Eliodoro Guerin, DO for review.  Patient has been informed they will be contacted when we receive the results via telephone or MyChart

## 2024-01-01 LAB — CBC WITH DIFFERENTIAL/PLATELET
Basophils Absolute: 0.1 10*3/uL (ref 0.0–0.2)
Basos: 1 %
EOS (ABSOLUTE): 0.4 10*3/uL (ref 0.0–0.4)
Eos: 5 %
Hematocrit: 45 % (ref 37.5–51.0)
Hemoglobin: 14.6 g/dL (ref 13.0–17.7)
Immature Grans (Abs): 0 10*3/uL (ref 0.0–0.1)
Immature Granulocytes: 0 %
Lymphocytes Absolute: 1.3 10*3/uL (ref 0.7–3.1)
Lymphs: 19 %
MCH: 30.5 pg (ref 26.6–33.0)
MCHC: 32.4 g/dL (ref 31.5–35.7)
MCV: 94 fL (ref 79–97)
Monocytes Absolute: 0.5 10*3/uL (ref 0.1–0.9)
Monocytes: 7 %
Neutrophils Absolute: 4.5 10*3/uL (ref 1.4–7.0)
Neutrophils: 68 %
Platelets: 293 10*3/uL (ref 150–450)
RBC: 4.78 x10E6/uL (ref 4.14–5.80)
RDW: 13.7 % (ref 11.6–15.4)
WBC: 6.7 10*3/uL (ref 3.4–10.8)

## 2024-01-01 LAB — CMP14+EGFR
ALT: 20 IU/L (ref 0–44)
AST: 21 IU/L (ref 0–40)
Albumin: 3.9 g/dL — ABNORMAL LOW (ref 4.1–5.1)
Alkaline Phosphatase: 103 IU/L (ref 44–121)
BUN/Creatinine Ratio: 15 (ref 9–20)
BUN: 14 mg/dL (ref 6–24)
Bilirubin Total: 0.3 mg/dL (ref 0.0–1.2)
CO2: 23 mmol/L (ref 20–29)
Calcium: 9.2 mg/dL (ref 8.7–10.2)
Chloride: 103 mmol/L (ref 96–106)
Creatinine, Ser: 0.91 mg/dL (ref 0.76–1.27)
Globulin, Total: 2.3 g/dL (ref 1.5–4.5)
Glucose: 219 mg/dL — ABNORMAL HIGH (ref 70–99)
Potassium: 4.3 mmol/L (ref 3.5–5.2)
Sodium: 142 mmol/L (ref 134–144)
Total Protein: 6.2 g/dL (ref 6.0–8.5)
eGFR: 105 mL/min/{1.73_m2} (ref >59)

## 2024-01-01 LAB — LIPID PANEL
Chol/HDL Ratio: 5.7 ratio — ABNORMAL HIGH (ref 0.0–5.0)
Cholesterol, Total: 193 mg/dL (ref 100–199)
HDL: 34 mg/dL — ABNORMAL LOW (ref >39)
LDL Chol Calc (NIH): 121 mg/dL — ABNORMAL HIGH (ref 0–99)
Triglycerides: 214 mg/dL — ABNORMAL HIGH (ref 0–149)
VLDL Cholesterol Cal: 38 mg/dL (ref 5–40)

## 2024-01-01 LAB — HIV ANTIBODY (ROUTINE TESTING W REFLEX)

## 2024-01-01 LAB — PSA: Prostate Specific Ag, Serum: 0.2 ng/mL (ref 0.0–4.0)

## 2024-01-01 LAB — HEPATITIS C ANTIBODY

## 2024-02-03 ENCOUNTER — Ambulatory Visit: Payer: Self-pay | Admitting: Family Medicine

## 2024-02-05 ENCOUNTER — Other Ambulatory Visit: Payer: Self-pay | Admitting: Family Medicine

## 2024-02-05 DIAGNOSIS — E1069 Type 1 diabetes mellitus with other specified complication: Secondary | ICD-10-CM

## 2024-03-20 ENCOUNTER — Other Ambulatory Visit: Payer: Self-pay | Admitting: Family Medicine

## 2024-03-20 DIAGNOSIS — E1069 Type 1 diabetes mellitus with other specified complication: Secondary | ICD-10-CM

## 2024-03-24 ENCOUNTER — Ambulatory Visit: Payer: Self-pay

## 2024-03-24 NOTE — Telephone Encounter (Signed)
 Called to schedule appt but pt was already at urgent care

## 2024-03-24 NOTE — Telephone Encounter (Signed)
 FYI Only or Action Required?: FYI only for provider.:   Patient was last seen in primary care on 12/24/2023 by Jolinda Norene HERO, DO.  Called Nurse Triage reporting Pain and Back Pain.  Symptoms began a week ago.  Interventions attempted: Prescription medications: muscle relaxer.  Symptoms are: gradually worsening.  Triage Disposition: See PCP When Office is Open (Within 3 Days)  Patient/caregiver understands and will follow disposition?: Yes    Copied from CRM #[id number]. Topic: Clinical - Red Word Triage >> Mar 24, 2024  9:23 AM Ivette P wrote: Red Word that prompted transfer to Nurse Triage: Neck back and arms. Knot in back and getting worse. sharp pain shooting down arm and elbow. Reason for Disposition  [1] MODERATE back pain (e.g., interferes with normal activities) AND [2] present > 3 days  Answer Assessment - Initial Assessment Questions 1. ONSET: When did the pain begin? (e.g., minutes, hours, days)     X week 2. LOCATION: Where does it hurt? (upper, mid or lower back)     Starts Bottom of neck down back and i 3. SEVERITY: How bad is the pain?  (e.g., Scale 1-10; mild, moderate, or severe)     Moderate to severe 4. PATTERN: Is the pain constant? (e.g., yes, no; constant, intermittent)      constant 5. RADIATION: Does the pain shoot into your legs or somewhere else?   bilateral arms and elbow 6. CAUSE:  What do you think is causing the back pain?      unknown 7. BACK OVERUSE:  Any recent lifting of heavy objects, strenuous work or exercise?     no 8. MEDICINES: What have you taken so far for the pain? (e.g., nothing, acetaminophen, NSAIDS)     Muscle relaxer 9. NEUROLOGIC SYMPTOMS: Do you have any weakness, numbness, or problems with bowel/bladder control?     na 10. OTHER SYMPTOMS: Do you have any other symptoms? (e.g., fever, abdomen pain, burning with urination, blood in urine)       na 11. PREGNANCY: Is there any chance you are pregnant?  When was your last menstrual period?       Na  Pt reports this pain is tightness. Pt stated he needed to be seen today by a provider: no appts open today with PCP or providers within office: pt stated he will go to urgent care.  Protocols used: Back Pain-A-AH

## 2024-03-26 ENCOUNTER — Other Ambulatory Visit: Payer: Self-pay | Admitting: Family Medicine

## 2024-04-06 ENCOUNTER — Encounter (INDEPENDENT_AMBULATORY_CARE_PROVIDER_SITE_OTHER): Payer: Self-pay | Admitting: Family Medicine

## 2024-04-06 DIAGNOSIS — M5412 Radiculopathy, cervical region: Secondary | ICD-10-CM

## 2024-04-07 ENCOUNTER — Telehealth: Payer: Self-pay | Admitting: Family Medicine

## 2024-04-07 MED ORDER — BACLOFEN 20 MG PO TABS: 20.0000 mg | ORAL_TABLET | Freq: Two times a day (BID) | ORAL | 3 refills | Status: DC | PRN

## 2024-04-07 MED ORDER — DICLOFENAC SODIUM 75 MG PO TBEC: 75.0000 mg | DELAYED_RELEASE_TABLET | Freq: Two times a day (BID) | ORAL | 3 refills | Status: DC | PRN

## 2024-04-07 NOTE — Telephone Encounter (Signed)
 Looks like patient had a visit with PCP through Mychart yesterday. Okay to write note?

## 2024-04-07 NOTE — Telephone Encounter (Signed)
 Wants work note for 04-05-2024 to 04-09-2024. Please call patient when done.

## 2024-04-07 NOTE — Telephone Encounter (Signed)
Yes ok to write

## 2024-04-07 NOTE — Telephone Encounter (Signed)

## 2024-04-07 NOTE — Telephone Encounter (Signed)
 Note printed and placed at the front, patient aware and will pick up tomorrow.

## 2024-04-13 ENCOUNTER — Encounter: Payer: Self-pay | Admitting: Family Medicine

## 2024-04-13 ENCOUNTER — Ambulatory Visit: Admitting: Family Medicine

## 2024-04-13 VITALS — BP 140/95 | HR 102 | Temp 97.8°F | Ht 71.0 in | Wt 240.1 lb

## 2024-04-13 DIAGNOSIS — M5412 Radiculopathy, cervical region: Secondary | ICD-10-CM

## 2024-04-13 DIAGNOSIS — M79601 Pain in right arm: Secondary | ICD-10-CM

## 2024-04-13 DIAGNOSIS — R200 Anesthesia of skin: Secondary | ICD-10-CM

## 2024-04-13 DIAGNOSIS — R202 Paresthesia of skin: Secondary | ICD-10-CM

## 2024-04-13 NOTE — Progress Notes (Addendum)
 Subjective: CC: arm pain PCP: Jolinda Norene HERO, DO Gary Stone is a 48 y.o. male presenting to clinic today for:  History of Present Illness   Patient reports that the right arm pain and shoulder pain has been going on for about a month now.  No preceding injury.  He does do a lot of heavy lifting and repetitive activities at work so was not sure if it was a work-related injury but he cannot really cite any specific event that occurred that would have caused this discomfort.  He just woke up with it 1 morning.  He notes that he was seen at the urgent care last month 03/20/24 and was thought to have an acute cervical strain with possible fascial or tendon of the triceps strain.  He had a repeat evaluation on 8/27.  He had plain films that demonstrate no evidence of disc space abnormalities or degenerative changes.  However, he goes on to note that when he moves his neck in a certain position sometimes that alleviates the severe pain that shoots down his right arm.  He also had cardiac workup and notes that he was found to have maybe some evidence of an old ischemic injury but no active abnormalities.  He was placed on baclofen  and diclofenac  and that did seem to help slightly take the edge off but has not really resolved the issue.  He does report occasional numbness and tingling in bilateral hands right greater than left but this is not something that is constant like the pain tends to be in the right arm, which is described as sharp.  He points to the area of the triceps as one of the main areas of discomfort but it does radiate down into the forearm.  ROS: Per HPI  No Known Allergies Past Medical History:  Diagnosis Date   Anxiety    Diabetes mellitus without complication (HCC)     Current Outpatient Medications:    ALPRAZolam  (XANAX ) 1 MG tablet, Take 1 tablet (1 mg total) by mouth 2 (two) times daily as needed for anxiety., Disp: 60 tablet, Rfl: 5   amphetamine-dextroamphetamine  (ADDERALL XR) 10 MG 24 hr capsule, Take 10 mg by mouth daily., Disp: , Rfl:    baclofen  (LIORESAL ) 20 MG tablet, Take 1 tablet (20 mg total) by mouth 2 (two) times daily as needed for muscle spasms., Disp: 60 tablet, Rfl: 3   blood glucose meter kit and supplies KIT, Dispense based on patient and insurance preference. Use up to four times daily as directed. (FOR ICD-9 250.00, 250.01)., Disp: 1 each, Rfl: 0   Continuous Glucose Sensor (DEXCOM G6 SENSOR) MISC, Use to check Blood glucose up to 4 times daily, Disp: 90 each, Rfl: 9   Continuous Glucose Transmitter (DEXCOM G6 TRANSMITTER) MISC, Use w/ Dexcom sensors, Disp: 1 each, Rfl: 0   diclofenac  (VOLTAREN ) 75 MG EC tablet, Take 1 tablet (75 mg total) by mouth 2 (two) times daily as needed for moderate pain (pain score 4-6)., Disp: 60 tablet, Rfl: 3   gabapentin (NEURONTIN) 800 MG tablet, Take 800 mg by mouth 3 (three) times daily., Disp: , Rfl:    HYDROcodone-acetaminophen (NORCO) 10-325 MG tablet, Take 1 tablet by mouth every 8 (eight) hours as needed., Disp: , Rfl:    insulin  aspart (NOVOLOG  FLEXPEN) 100 UNIT/ML FlexPen, Inject 5-20 Units into the skin 3 (three) times daily with meals., Disp: 45 mL, Rfl: 11   Insulin  Degludec FlexTouch 100 UNIT/ML SOPN, INJECT 30 UNITS INTO  THE SKIN 2 (TWO) TIMES DAILY., Disp: 15 mL, Rfl: 1   Insulin  Pen Needle 32G X 6 MM MISC, UAD with insulin  E10.69, Disp: 100 each, Rfl: 3   Insulin  Syringe-Needle U-100 (B-D INS SYRINGE 0.5CC/30GX1/2) 30G X 1/2 0.5 ML MISC, 1 each by Does not apply route 2 (two) times daily., Disp: 100 each, Rfl: 2   morphine (MS CONTIN) 15 MG 12 hr tablet, Take 15 mg by mouth every 12 (twelve) hours., Disp: , Rfl:    NARCAN 4 MG/0.1ML LIQD nasal spray kit, 1 (ONE) SPRAY AS NEEDED, Disp: , Rfl: 3   ONETOUCH ULTRA TEST test strip, TEST BLOOD SUGAR 5 TIMES DAILY DX E10.9, Disp: 100 strip, Rfl: 19   pravastatin  (PRAVACHOL ) 40 MG tablet, Take 1 tablet (40 mg total) by mouth daily., Disp: 90 tablet,  Rfl: 3 Social History   Socioeconomic History   Marital status: Married    Spouse name: Not on file   Number of children: Not on file   Years of education: Not on file   Highest education level: Some college, no degree  Occupational History   Not on file  Tobacco Use   Smoking status: Every Day    Current packs/day: 0.25    Types: Cigarettes   Smokeless tobacco: Never  Vaping Use   Vaping status: Never Used  Substance and Sexual Activity   Alcohol use: No   Drug use: No   Sexual activity: Yes  Other Topics Concern   Not on file  Social History Narrative   Not on file   Social Drivers of Health   Financial Resource Strain: Low Risk  (04/12/2024)   Overall Financial Resource Strain (CARDIA)    Difficulty of Paying Living Expenses: Not very hard  Food Insecurity: No Food Insecurity (04/12/2024)   Hunger Vital Sign    Worried About Running Out of Food in the Last Year: Never true    Ran Out of Food in the Last Year: Never true  Transportation Needs: No Transportation Needs (04/12/2024)   PRAPARE - Administrator, Civil Service (Medical): No    Lack of Transportation (Non-Medical): No  Physical Activity: Sufficiently Active (04/12/2024)   Exercise Vital Sign    Days of Exercise per Week: 5 days    Minutes of Exercise per Session: 150+ min  Stress: No Stress Concern Present (04/12/2024)   [employer] of Occupational Health - Occupational Stress Questionnaire    Feeling of Stress: Only a little  Social Connections: Unknown (04/12/2024)   Social Connection and Isolation Panel    Frequency of Communication with Friends and Family: Patient declined    Frequency of Social Gatherings with Friends and Family: Patient declined    Attends Religious Services: Patient declined    Database administrator or Organizations: Patient declined    Attends Engineer, structural: Not on file    Marital Status: Patient declined  Catering manager Violence: Not on file    Family History  Problem Relation Age of Onset   Healthy Mother    Healthy Father     Objective: Office vital signs reviewed. BP (!) 140/95   Pulse (!) 102   Temp 97.8 F (36.6 C)   Ht 5' 11 (1.803 m)   Wt 240 lb 2 oz (108.9 kg)   SpO2 96%   BMI 33.49 kg/m   Physical Examination:  General: Awake, alert, nontoxic male, No acute distress HEENT: sclera white, MMM MSK: He has about a 20  degree loss of active range of motion in abduction of the right shoulder.  He has markedly reduced extension of the cervical spine but full active range of motion in flexion and rotation with mild reduction in sidebending of the cervical spine bilaterally.  No midline tenderness palpation to the C-spine.  No palpable step-offs or abnormalities.  Spurling maneuver to the left and right reproduces pain in the right upper extremity. He has 4/5 upper extremity weakness bilaterally with negative empty can testing.   Neuro: He does have decreased light touch sensation in the C7-8 dermatome of right upper extremity.  C spine xray results from urgent care:  Assessment/ Plan: 48 y.o. male  Assessment & Plan   Cervical radicular pain - Plan: MR Cervical Spine Wo Contrast, Ambulatory referral to Orthopedic Surgery  Numbness and tingling of right arm - Plan: MR Cervical Spine Wo Contrast, Ambulatory referral to Orthopedic Surgery  Pain in right arm - Plan: MR Cervical Spine Wo Contrast, Ambulatory referral to Orthopedic Surgery  I am going to see if we can get a stat MRI given his ongoing and slightly progressive symptoms.  We discussed that his insurance may mandate that he has physical therapy for 6 weeks prior to covering an MRI but we will certainly try and get this ordered I also changed his orthopedic referral to stat send symptoms are ongoing.  He will be contacted with these appointments and I have asked that he contact me should he not get calls in the next 24 hours for this.  He has refills on both  the anti-inflammatory and muscle relaxant.  Work note provided through the week and I anticipate that orthopedics will take over restrictions and return to work pending their assessment and recommendations  Prudie Guthridge CHRISTELLA Fielding, DO [facility] [phone]

## 2024-04-13 NOTE — Patient Instructions (Signed)
 Cervical Radiculopathy  Cervical radiculopathy means that a nerve in the neck (a cervical nerve) is pinched or bruised. This can happen because of an injury to the cervical spine (vertebrae) in the neck, or as a normal part of getting older. This condition can cause pain or loss of feeling (numbness) that runs from your neck all the way down to your arm and fingers. Often, this condition gets better with rest. Treatment may be needed if the condition does not get better. What are the causes? A neck injury. A bulging disk in your spine. Sudden muscle tightening (muscle spasms). Tight muscles in your neck due to overuse. Arthritis. Breakdown in the bones and joints of the spine (spondylosis) due to getting older. Bone spurs that form near the nerves in the neck. What are the signs or symptoms? Pain. The pain may: Run from the neck to the arm and hand. Be very bad or irritating. Get worse when you move your neck. Loss of feeling or tingling in your arm or hand. Weakness in your arm or hand, in very bad cases. How is this treated? In many cases, treatment is not needed for this condition. With rest, the condition often gets better over time. If treatment is needed, options may include: Wearing a soft neck collar (cervical collar) for short periods of time. Doing exercises (physical therapy) to strengthen your neck muscles. Taking medicines. Having shots (injections) in your spine, in very bad cases. Having surgery. This may be needed if other treatments do not help. The type of surgery that is used will depend on the cause of your condition. Follow these instructions at home: If you have a soft neck collar: Wear it as told by your doctor. Take it off only as told by your doctor. Ask your doctor if you can take the collar off for cleaning and bathing. If you are allowed to take the collar off for cleaning or bathing: Follow instructions from your doctor about how to take off the collar  safely. Clean the collar by wiping it with mild soap and water and drying it completely. Take out any removable pads in the collar every 1-2 days. Wash them by hand with soap and water. Let them air-dry completely before you put them back in the collar. Check your skin under the collar for redness or sores. If you see any, tell your doctor. Managing pain     Take over-the-counter and prescription medicines only as told by your doctor. If told, put ice on the painful area. To do this: If you have a soft neck collar, take if off as told by your doctor. Put ice in a plastic bag. Place a towel between your skin and the bag. Leave the ice on for 20 minutes, 2-3 times a day. Take off the ice if your skin turns bright red. This is very important. If you cannot feel pain, heat, or cold, you have a greater risk of damage to the area. If using ice does not help, you can try using heat. Use the heat source that your doctor recommends, such as a moist heat pack or a heating pad. Place a towel between your skin and the heat source. Leave the heat on for 20-30 minutes. Take off the heat if your skin turns bright red. This is very important. If you cannot feel pain, heat, or cold, you have a greater risk of getting burned. You may try a gentle neck and shoulder rub (massage). Activity Rest as needed. Return  to your normal activities when your doctor says that it is safe. Do exercises as told by your doctor or physical therapist. You may have to avoid lifting. Ask your doctor how much you can safely lift. General instructions Use a flat pillow when you sleep. Do not drive while wearing a soft neck collar. If you do not have a soft neck collar, ask your doctor if it is safe to drive while your neck heals. Ask your doctor if you should avoid driving or using machines while you are taking your medicine. Do not smoke or use any products that contain nicotine or tobacco. If you need help quitting, ask your  doctor. Keep all follow-up visits. Contact a doctor if: Your condition does not get better with treatment. Get help right away if: Your pain gets worse and medicine does not help. You lose feeling or feel weak in your hand, arm, face, or leg. You have a high fever. Your neck is stiff. You cannot control when you poop or pee (have incontinence). You have trouble with walking, balance, or talking. Summary Cervical radiculopathy means that a nerve in the neck is pinched or bruised. A nerve can get pinched from a bulging disk, arthritis, an injury to the neck, or other causes. Symptoms include pain, tingling, or loss of feeling that goes from the neck to the arm or hand. Weakness in your arm or hand can happen in very bad cases. Treatment may include resting, wearing a soft neck collar, and doing exercises. You might need to take medicines for pain. In very bad cases, shots or surgery may be needed. This information is not intended to replace advice given to you by your health care provider. Make sure you discuss any questions you have with your health care provider. Document Revised: 01/18/2021 Document Reviewed: 01/18/2021 Elsevier Patient Education  2024 [employer].

## 2024-04-18 ENCOUNTER — Ambulatory Visit (HOSPITAL_COMMUNITY)
Admission: RE | Admit: 2024-04-18 | Discharge: 2024-04-18 | Disposition: A | Discharge status: Home / Self Care | Source: Ambulatory Visit | Attending: Family Medicine | Admitting: Family Medicine

## 2024-04-18 ENCOUNTER — Other Ambulatory Visit: Payer: Self-pay | Admitting: Family Medicine

## 2024-04-18 ENCOUNTER — Encounter (HOSPITAL_COMMUNITY): Payer: Self-pay

## 2024-04-18 DIAGNOSIS — M79601 Pain in right arm: Secondary | ICD-10-CM | POA: Diagnosis present

## 2024-04-18 DIAGNOSIS — R200 Anesthesia of skin: Secondary | ICD-10-CM | POA: Insufficient documentation

## 2024-04-18 DIAGNOSIS — M5412 Radiculopathy, cervical region: Secondary | ICD-10-CM

## 2024-04-18 DIAGNOSIS — R202 Paresthesia of skin: Secondary | ICD-10-CM | POA: Diagnosis present

## 2024-04-18 DIAGNOSIS — Z0189 Encounter for other specified special examinations: Secondary | ICD-10-CM | POA: Insufficient documentation

## 2024-04-18 HISTORY — DX: Superficial foreign body of scalp, initial encounter: S00.05XA

## 2024-04-19 ENCOUNTER — Other Ambulatory Visit: Payer: Self-pay | Admitting: Family Medicine

## 2024-04-19 ENCOUNTER — Ambulatory Visit: Payer: Self-pay | Admitting: Family Medicine

## 2024-04-19 DIAGNOSIS — M5412 Radiculopathy, cervical region: Secondary | ICD-10-CM

## 2024-04-19 DIAGNOSIS — M5030 Other cervical disc degeneration, unspecified cervical region: Secondary | ICD-10-CM

## 2024-04-20 NOTE — Progress Notes (Addendum)
 Referring Physician:  Jolinda Norene HERO, DO [address] [place],  [place] [address]  Primary Physician:  Jolinda Norene HERO, DO  History of Present Illness: 04/21/2024 Gary Stone is here today with a chief complaint of a right sided cervical radiculopathy.  He was in his usual state of health until approximately 1 month ago developed severe pain in his periscapular region.  This eventually progressed to shooting down his right arm down the back of his arm.  He has had severe neck stiffness and pain.  He noticed significant weakness numbness and tingling in his right arm specifically.  He often will wake up with severe pain.  He noticed that he can make it better by leaning his head away or flexing his neck.  If he leans his head towards the area or extends his neck he gets severe shooting pain down into his arm.  Conservative measures:  Physical therapy: Has not participated in. Multimodal medical therapy including regular antiinflammatories: baclofen , diclofenac , and tizanidine . Injections: No epidural steroid injections.  Due to the symptoms are causing a significant impact on the patient's life.   Review of Systems:  A 10 point review of systems is negative, except for the pertinent positives and negatives detailed in the HPI.  Past Medical History: Past Medical History:  Diagnosis Date   Anxiety    Diabetes mellitus without complication (HCC)    Metal foreign body in scalp    seen on skull xray 04/18/2024    Past Surgical History: Past Surgical History:  Procedure Laterality Date   ANKLE SURGERY Right    DENTAL SURGERY Bilateral    new upper, then did lower teeth removal & dentures    Allergies: Allergies as of 04/21/2024   (No Known Allergies)    Medications: Outpatient Encounter Medications as of 04/21/2024  Medication Sig   ALPRAZolam  (XANAX ) 1 MG tablet Take 1 tablet (1 mg total) by mouth 2 (two) times daily as needed for anxiety.    amphetamine-dextroamphetamine (ADDERALL XR) 10 MG 24 hr capsule Take 10 mg by mouth daily.   baclofen  (LIORESAL ) 20 MG tablet Take 1 tablet (20 mg total) by mouth 2 (two) times daily as needed for muscle spasms.   blood glucose meter kit and supplies KIT Dispense based on patient and insurance preference. Use up to four times daily as directed. (FOR ICD-9 250.00, 250.01).   Continuous Glucose Sensor (DEXCOM G6 SENSOR) MISC Use to check Blood glucose up to 4 times daily   Continuous Glucose Transmitter (DEXCOM G6 TRANSMITTER) MISC Use w/ Dexcom sensors   diclofenac  (VOLTAREN ) 75 MG EC tablet Take 1 tablet (75 mg total) by mouth 2 (two) times daily as needed for moderate pain (pain score 4-6).   gabapentin (NEURONTIN) 800 MG tablet Take 800 mg by mouth 3 (three) times daily.   HYDROcodone-acetaminophen (NORCO) 10-325 MG tablet Take 1 tablet by mouth every 8 (eight) hours as needed.   insulin  aspart (NOVOLOG  FLEXPEN) 100 UNIT/ML FlexPen Inject 5-20 Units into the skin 3 (three) times daily with meals.   Insulin  Degludec FlexTouch 100 UNIT/ML SOPN INJECT 30 UNITS INTO THE SKIN 2 (TWO) TIMES DAILY.   Insulin  Pen Needle 32G X 6 MM MISC UAD with insulin  E10.69   Insulin  Syringe-Needle U-100 (B-D INS SYRINGE 0.5CC/30GX1/2) 30G X 1/2 0.5 ML MISC 1 each by Does not apply route 2 (two) times daily.   morphine (MS CONTIN) 15 MG 12 hr tablet Take 15 mg by mouth every 12 (twelve)  hours.   NARCAN 4 MG/0.1ML LIQD nasal spray kit 1 (ONE) SPRAY AS NEEDED   ONETOUCH ULTRA TEST test strip TEST BLOOD SUGAR 5 TIMES DAILY DX E10.9   pravastatin  (PRAVACHOL ) 40 MG tablet Take 1 tablet (40 mg total) by mouth daily.   No facility-administered encounter medications on file as of 04/21/2024.    Social History: Social History   Tobacco Use   Smoking status: Every Day    Current packs/day: 0.25    Types: Cigarettes   Smokeless tobacco: Never  Vaping Use   Vaping status: Never Used  Substance Use Topics   Alcohol  use: No   Drug use: No    Family Medical History: Family History  Problem Relation Age of Onset   Healthy Mother    Healthy Father     Physical Examination:  NEUROLOGICAL:     Awake, alert, oriented to person, place, and time.  Speech is clear and fluent. Fund of knowledge is appropriate.   Spurling sign is positive  Strength: Side Biceps Triceps Deltoid Interossei Grip Wrist Ext. Wrist Flex.  R 4+ 4- 5 5 5 4 4   L 5 5 5 5 5 5 5     Decreased right biceps reflex, trace to absent right triceps reflex.   Hoffman's is absent.  Clonus is not present. Bilateral upper and lower extremity sensation is intact to light touch, with the exception of a significant decrease in sensation in the right C7 distribution and a mild change in the right C6 distribution. Gait is normal.   No difficulty with tandem gait.   No evidence of dysmetria noted.  Medical Decision Making  Imaging: Narrative & Impression  CLINICAL DATA:  Neck and right arm pain for 3-4 weeks.   EXAM: MRI CERVICAL SPINE WITHOUT CONTRAST   TECHNIQUE: Multiplanar, multisequence MR imaging of the cervical spine was performed. No intravenous contrast was administered.   COMPARISON:  None Available.   FINDINGS: Alignment: Normal. Mild straightening of the normal cervical lordosis which could be due to positioning, muscle spasm or pain.   Vertebrae: Normal marrow signal.  No bone lesions or fractures.   Cord: Normal cord signal intensity.  No cord lesions or syrinx.   Posterior Fossa, vertebral arteries, paraspinal tissues: No significant findings.   Disc levels:   C2-3: No significant findings.   C3-4: Small central disc protrusion with mild focal impression on the ventral thecal sac. No significant neural compression or foraminal stenosis.   C4-5: Small central disc protrusion with mild impression on the central aspect of the thecal sac but no significant neural compression or foraminal stenosis.    C5-6: Broad-based disc extrusion more pronounced on the right side with significant mass effect on the right side of the thecal sac and contacting the ventral aspect of the cervical cord. No significant foraminal stenosis.   C6-7: Broad-based disc protrusion with slight extrusion of disc material up behind the C6 vertebral body and down behind the C7 vertebral body. There is asymmetric mass effect on the ventral thecal sac rightward. Moderate right foraminal stenosis is also noted.   C7-T1: No significant findings.   IMPRESSION: 1. Small central disc protrusions at C3-4 and C4-5. 2. Significant disc protrusions/extrusions at C5-6 and C6-7 as detailed above explaining the patient's right radicular symptoms.     Electronically Signed   By: MYRTIS Stammer M.D.   On: 04/18/2024 13:58   I have also reviewed his cervical x-rays, they do not demonstrate any areas of instability or  severe evidence of mobile spondylolisthesis.   I have personally reviewed the images and agree with the above interpretation.  Assessment and Plan: Gary Stone is a pleasant 48 y.o. male with approximately 4 weeks of severe cervical radiculopathy.  He has had medial scapular pain as well as radiating pain down his right upper extremity predominantly in the C7 distribution but somewhat in the C6 distribution as well.  He has had progressive pain and numbness and weakness in his right upper extremity which is causing significant deficit.  His also stopped him from being able to perform his activities of daily living and job.  On physical examination he has weakness in his right bicep but more severe weakness in his right tricep and wrist flexion.  He has a loss of his triceps reflex and decreased right biceps reflex.  He has decreased sensation in his C7 and C6 distribution on the right.  He has a positive Spurling sign.  His MRI demonstrates significant disc protrusions at C5-6 and C6-7 on the right side with, there is  at least moderate stenosis in the right neuroforamen at C6-7 and there is also some caudal and cranial extrusion of the disc material.  At C5-6 there is a broad-based right sided disc which impacts the ventral cervical cord.  At this point the patient has a significant right-sided cervical radiculopathy with progressive weakness in his right upper extremity.  This is mostly in the C7 myotome/dermatome, however he also has some C6 symptomatology.  Given his large C6-7 disc herniation that is right sided causing significant foraminal stenosis and to smaller but compressive C5-6 disc herniation which is rightward predominant we will plan for a C6-7 and C5-6 anterior cervical disc arthroplasty.  He has greater than 3 mm of disc height, he has less than 11 degrees of cervical kyphosis, and he does not have extensive bony overgrowth that would keep him from a good outcome and a cervical disc arthroplasty.  He has not yet had physical therapy, however he does have a progressive deficit with 4 - weakness in the right tricep, 4 out of 5 weakness in the right wrist extension and flexion, and 4+ out of 5 in right biceps.  He has decreased sensation in the C6 and C7 dermatome and he feels that his symptoms continue to worsen over the past month.  He has not had any cervical surgery before no radiation.  He is not currently taking any tobacco products or nicotine exposure.  He will plan to reach out to us  if he would like to go forward with the arthroplasty.  Risk and benefits were discussed including weakness numbness tingling failure to improve his symptoms, need for further surgery.  Penne MICAEL Sharps, MD Dept. of Neurosurgery     Addendum: 04/26/2024 I was able to review his cervical x-rays, they did not show any evidence of dynamic instability.  Given these findings he would qualify for anterior cervical disc arthroplasty from C5-C7 to treat his significant disc protrusions extrusions at C5-6 and C6-7 which are  causing his C6 and C7 compression and C6-7 radiculopathy with progressive deficits..  He has progressive weakness and severe deficit in the right upper extremity predominantly.  We discussed the risks and benefits of surgery in clinic and he would like to go forward with the anterior cervical disc arthroplasty at C5-C7

## 2024-04-20 NOTE — H&P (View-Only) (Signed)
 Referring Physician:  Jolinda Norene HERO, DO [address] [place],  [place] [address]  Primary Physician:  Jolinda Norene HERO, DO  History of Present Illness: 04/21/2024 Mr. Banjamin Stovall is here today with a chief complaint of a right sided cervical radiculopathy.  He was in his usual state of health until approximately 1 month ago developed severe pain in his periscapular region.  This eventually progressed to shooting down his right arm down the back of his arm.  He has had severe neck stiffness and pain.  He noticed significant weakness numbness and tingling in his right arm specifically.  He often will wake up with severe pain.  He noticed that he can make it better by leaning his head away or flexing his neck.  If he leans his head towards the area or extends his neck he gets severe shooting pain down into his arm.  Conservative measures:  Physical therapy: Has not participated in. Multimodal medical therapy including regular antiinflammatories: baclofen , diclofenac , and tizanidine . Injections: No epidural steroid injections.  Due to the symptoms are causing a significant impact on the patient's life.   Review of Systems:  A 10 point review of systems is negative, except for the pertinent positives and negatives detailed in the HPI.  Past Medical History: Past Medical History:  Diagnosis Date   Anxiety    Diabetes mellitus without complication (HCC)    Metal foreign body in scalp    seen on skull xray 04/18/2024    Past Surgical History: Past Surgical History:  Procedure Laterality Date   ANKLE SURGERY Right    DENTAL SURGERY Bilateral    new upper, then did lower teeth removal & dentures    Allergies: Allergies as of 04/21/2024   (No Known Allergies)    Medications: Outpatient Encounter Medications as of 04/21/2024  Medication Sig   ALPRAZolam  (XANAX ) 1 MG tablet Take 1 tablet (1 mg total) by mouth 2 (two) times daily as needed for anxiety.    amphetamine-dextroamphetamine (ADDERALL XR) 10 MG 24 hr capsule Take 10 mg by mouth daily.   baclofen  (LIORESAL ) 20 MG tablet Take 1 tablet (20 mg total) by mouth 2 (two) times daily as needed for muscle spasms.   blood glucose meter kit and supplies KIT Dispense based on patient and insurance preference. Use up to four times daily as directed. (FOR ICD-9 250.00, 250.01).   Continuous Glucose Sensor (DEXCOM G6 SENSOR) MISC Use to check Blood glucose up to 4 times daily   Continuous Glucose Transmitter (DEXCOM G6 TRANSMITTER) MISC Use w/ Dexcom sensors   diclofenac  (VOLTAREN ) 75 MG EC tablet Take 1 tablet (75 mg total) by mouth 2 (two) times daily as needed for moderate pain (pain score 4-6).   gabapentin (NEURONTIN) 800 MG tablet Take 800 mg by mouth 3 (three) times daily.   HYDROcodone-acetaminophen (NORCO) 10-325 MG tablet Take 1 tablet by mouth every 8 (eight) hours as needed.   insulin  aspart (NOVOLOG  FLEXPEN) 100 UNIT/ML FlexPen Inject 5-20 Units into the skin 3 (three) times daily with meals.   Insulin  Degludec FlexTouch 100 UNIT/ML SOPN INJECT 30 UNITS INTO THE SKIN 2 (TWO) TIMES DAILY.   Insulin  Pen Needle 32G X 6 MM MISC UAD with insulin  E10.69   Insulin  Syringe-Needle U-100 (B-D INS SYRINGE 0.5CC/30GX1/2) 30G X 1/2 0.5 ML MISC 1 each by Does not apply route 2 (two) times daily.   morphine (MS CONTIN) 15 MG 12 hr tablet Take 15 mg by mouth every 12 (twelve)  hours.   NARCAN 4 MG/0.1ML LIQD nasal spray kit 1 (ONE) SPRAY AS NEEDED   ONETOUCH ULTRA TEST test strip TEST BLOOD SUGAR 5 TIMES DAILY DX E10.9   pravastatin  (PRAVACHOL ) 40 MG tablet Take 1 tablet (40 mg total) by mouth daily.   No facility-administered encounter medications on file as of 04/21/2024.    Social History: Social History   Tobacco Use   Smoking status: Every Day    Current packs/day: 0.25    Types: Cigarettes   Smokeless tobacco: Never  Vaping Use   Vaping status: Never Used  Substance Use Topics   Alcohol  use: No   Drug use: No    Family Medical History: Family History  Problem Relation Age of Onset   Healthy Mother    Healthy Father     Physical Examination:  NEUROLOGICAL:     Awake, alert, oriented to person, place, and time.  Speech is clear and fluent. Fund of knowledge is appropriate.   Spurling sign is positive  Strength: Side Biceps Triceps Deltoid Interossei Grip Wrist Ext. Wrist Flex.  R 4+ 4- 5 5 5 4 4   L 5 5 5 5 5 5 5     Decreased right biceps reflex, trace to absent right triceps reflex.   Hoffman's is absent.  Clonus is not present. Bilateral upper and lower extremity sensation is intact to light touch, with the exception of a significant decrease in sensation in the right C7 distribution and a mild change in the right C6 distribution. Gait is normal.   No difficulty with tandem gait.   No evidence of dysmetria noted.  Medical Decision Making  Imaging: Narrative & Impression  CLINICAL DATA:  Neck and right arm pain for 3-4 weeks.   EXAM: MRI CERVICAL SPINE WITHOUT CONTRAST   TECHNIQUE: Multiplanar, multisequence MR imaging of the cervical spine was performed. No intravenous contrast was administered.   COMPARISON:  None Available.   FINDINGS: Alignment: Normal. Mild straightening of the normal cervical lordosis which could be due to positioning, muscle spasm or pain.   Vertebrae: Normal marrow signal.  No bone lesions or fractures.   Cord: Normal cord signal intensity.  No cord lesions or syrinx.   Posterior Fossa, vertebral arteries, paraspinal tissues: No significant findings.   Disc levels:   C2-3: No significant findings.   C3-4: Small central disc protrusion with mild focal impression on the ventral thecal sac. No significant neural compression or foraminal stenosis.   C4-5: Small central disc protrusion with mild impression on the central aspect of the thecal sac but no significant neural compression or foraminal stenosis.    C5-6: Broad-based disc extrusion more pronounced on the right side with significant mass effect on the right side of the thecal sac and contacting the ventral aspect of the cervical cord. No significant foraminal stenosis.   C6-7: Broad-based disc protrusion with slight extrusion of disc material up behind the C6 vertebral body and down behind the C7 vertebral body. There is asymmetric mass effect on the ventral thecal sac rightward. Moderate right foraminal stenosis is also noted.   C7-T1: No significant findings.   IMPRESSION: 1. Small central disc protrusions at C3-4 and C4-5. 2. Significant disc protrusions/extrusions at C5-6 and C6-7 as detailed above explaining the patient's right radicular symptoms.     Electronically Signed   By: MYRTIS Stammer M.D.   On: 04/18/2024 13:58   I have also reviewed his cervical x-rays, they do not demonstrate any areas of instability or  severe evidence of mobile spondylolisthesis.   I have personally reviewed the images and agree with the above interpretation.  Assessment and Plan: Mr. Alicea is a pleasant 48 y.o. male with approximately 4 weeks of severe cervical radiculopathy.  He has had medial scapular pain as well as radiating pain down his right upper extremity predominantly in the C7 distribution but somewhat in the C6 distribution as well.  He has had progressive pain and numbness and weakness in his right upper extremity which is causing significant deficit.  His also stopped him from being able to perform his activities of daily living and job.  On physical examination he has weakness in his right bicep but more severe weakness in his right tricep and wrist flexion.  He has a loss of his triceps reflex and decreased right biceps reflex.  He has decreased sensation in his C7 and C6 distribution on the right.  He has a positive Spurling sign.  His MRI demonstrates significant disc protrusions at C5-6 and C6-7 on the right side with, there is  at least moderate stenosis in the right neuroforamen at C6-7 and there is also some caudal and cranial extrusion of the disc material.  At C5-6 there is a broad-based right sided disc which impacts the ventral cervical cord.  At this point the patient has a significant right-sided cervical radiculopathy with progressive weakness in his right upper extremity.  This is mostly in the C7 myotome/dermatome, however he also has some C6 symptomatology.  Given his large C6-7 disc herniation that is right sided causing significant foraminal stenosis and to smaller but compressive C5-6 disc herniation which is rightward predominant we will plan for a C6-7 and C5-6 anterior cervical disc arthroplasty.  He has greater than 3 mm of disc height, he has less than 11 degrees of cervical kyphosis, and he does not have extensive bony overgrowth that would keep him from a good outcome and a cervical disc arthroplasty.  He has not yet had physical therapy, however he does have a progressive deficit with 4 - weakness in the right tricep, 4 out of 5 weakness in the right wrist extension and flexion, and 4+ out of 5 in right biceps.  He has decreased sensation in the C6 and C7 dermatome and he feels that his symptoms continue to worsen over the past month.  He has not had any cervical surgery before no radiation.  He is not currently taking any tobacco products or nicotine exposure.  He will plan to reach out to us  if he would like to go forward with the arthroplasty.  Risk and benefits were discussed including weakness numbness tingling failure to improve his symptoms, need for further surgery.  Penne MICAEL Sharps, MD Dept. of Neurosurgery     Addendum: 04/26/2024 I was able to review his cervical x-rays, they did not show any evidence of dynamic instability.  Given these findings he would qualify for anterior cervical disc arthroplasty from C5-C7 to treat his significant disc protrusions extrusions at C5-6 and C6-7 which are  causing his C6 and C7 compression and C6-7 radiculopathy with progressive deficits..  He has progressive weakness and severe deficit in the right upper extremity predominantly.  We discussed the risks and benefits of surgery in clinic and he would like to go forward with the anterior cervical disc arthroplasty at C5-C7

## 2024-04-21 ENCOUNTER — Encounter: Payer: Self-pay | Admitting: Physician Assistant

## 2024-04-21 ENCOUNTER — Ambulatory Visit
Admission: RE | Admit: 2024-04-21 | Discharge: 2024-04-21 | Disposition: A | Discharge status: Home / Self Care | Attending: Neurosurgery | Admitting: Neurosurgery

## 2024-04-21 ENCOUNTER — Ambulatory Visit (INDEPENDENT_AMBULATORY_CARE_PROVIDER_SITE_OTHER): Admitting: Physician Assistant

## 2024-04-21 ENCOUNTER — Ambulatory Visit
Admission: RE | Admit: 2024-04-21 | Discharge: 2024-04-21 | Disposition: A | Discharge status: Home / Self Care | Source: Ambulatory Visit | Attending: Neurosurgery | Admitting: Neurosurgery

## 2024-04-21 ENCOUNTER — Encounter: Payer: Self-pay | Admitting: Neurosurgery

## 2024-04-21 VITALS — BP 138/86 | Ht 71.0 in | Wt 244.1 lb

## 2024-04-21 DIAGNOSIS — R292 Abnormal reflex: Secondary | ICD-10-CM

## 2024-04-21 DIAGNOSIS — M4802 Spinal stenosis, cervical region: Secondary | ICD-10-CM

## 2024-04-21 DIAGNOSIS — M50122 Cervical disc disorder at C5-C6 level with radiculopathy: Secondary | ICD-10-CM | POA: Diagnosis not present

## 2024-04-21 DIAGNOSIS — M5010 Cervical disc disorder with radiculopathy, unspecified cervical region: Secondary | ICD-10-CM | POA: Diagnosis present

## 2024-04-21 DIAGNOSIS — M50123 Cervical disc disorder at C6-C7 level with radiculopathy: Secondary | ICD-10-CM | POA: Diagnosis not present

## 2024-04-26 ENCOUNTER — Ambulatory Visit: Payer: Self-pay | Admitting: Neurosurgery

## 2024-04-26 ENCOUNTER — Telehealth: Payer: Self-pay

## 2024-04-26 DIAGNOSIS — R208 Other disturbances of skin sensation: Secondary | ICD-10-CM | POA: Insufficient documentation

## 2024-04-26 DIAGNOSIS — M5010 Cervical disc disorder with radiculopathy, unspecified cervical region: Secondary | ICD-10-CM | POA: Insufficient documentation

## 2024-04-26 DIAGNOSIS — M6281 Muscle weakness (generalized): Secondary | ICD-10-CM

## 2024-04-27 ENCOUNTER — Telehealth: Payer: Self-pay | Admitting: Family Medicine

## 2024-04-27 DIAGNOSIS — Z0279 Encounter for issue of other medical certificate: Secondary | ICD-10-CM

## 2024-04-27 NOTE — Telephone Encounter (Signed)
 [employer] Moring dropped off FMLA forms to be completed and signed.  Form Fee Paid? (Y/N)       yes     If NO, form is placed on front office manager desk to hold until payment received. If YES, then form will be placed in the RX/HH Nurse Coordinators box for completion.  Form will not be processed until payment is received   Please fax forms, pt wants a copy & please call when ready!

## 2024-04-29 NOTE — Telephone Encounter (Signed)
 PCP completed and signed FMLA forms. They have been faxed to [employer] at fax number [phone]. Patient has been contacted and informed they are complete.Copy at front desk.

## 2024-05-04 ENCOUNTER — Other Ambulatory Visit: Payer: Self-pay

## 2024-05-04 DIAGNOSIS — R208 Other disturbances of skin sensation: Secondary | ICD-10-CM

## 2024-05-04 DIAGNOSIS — M6281 Muscle weakness (generalized): Secondary | ICD-10-CM

## 2024-05-04 DIAGNOSIS — Z01818 Encounter for other preprocedural examination: Secondary | ICD-10-CM

## 2024-05-04 DIAGNOSIS — M5010 Cervical disc disorder with radiculopathy, unspecified cervical region: Secondary | ICD-10-CM

## 2024-05-04 NOTE — Telephone Encounter (Signed)
 Spoke with Gary Stone to confirm surgery date of 10/21. Gary Stone has submitted prior auth. I will call pt tomorrow to discuss surgery instructions and schedule post op visits per pt request.

## 2024-05-04 NOTE — Telephone Encounter (Signed)
 Gary Stone read my previous mychart message, but did not respond. Sent follow up [employer].

## 2024-05-05 ENCOUNTER — Telehealth: Payer: Self-pay | Admitting: Family Medicine

## 2024-05-05 DIAGNOSIS — Z0279 Encounter for issue of other medical certificate: Secondary | ICD-10-CM

## 2024-05-05 NOTE — Telephone Encounter (Signed)
 PT dropped off Disability forms to be completed and signed.  Form Fee Paid? (Yes)            If NO, form is placed on front office manager desk to hold until payment received. If YES, then form will be placed in the RX/HH Nurse Coordinators box for completion.  Form will not be processed until payment is received

## 2024-05-05 NOTE — Telephone Encounter (Signed)
 Attempted to call patient to discuss surgery instructions. Left a voicemail informing patient that I was going to set up post op appointments and that he could find them in [place]. Sent him a Clinical cytogeneticist message encouraging him to read over surgery instructions and review post op appointments and reach out with any questions or concerns.

## 2024-05-10 NOTE — Telephone Encounter (Signed)
 Called and left another voicemail to follow up on surgical instructions. Patient did not answer his phone. Requested that he either send me a mychart message or give us  a call to indicate that he received his surgical instructions and make sure he did not have any questions.

## 2024-05-11 ENCOUNTER — Encounter
Admission: RE | Admit: 2024-05-11 | Discharge: 2024-05-11 | Disposition: A | Discharge status: Home / Self Care | Source: Ambulatory Visit | Attending: Neurosurgery | Admitting: Neurosurgery

## 2024-05-11 ENCOUNTER — Other Ambulatory Visit: Payer: Self-pay

## 2024-05-11 VITALS — BP 160/100 | HR 96 | Temp 98.2°F | Resp 18 | Ht 71.0 in | Wt 235.0 lb

## 2024-05-11 DIAGNOSIS — Z01812 Encounter for preprocedural laboratory examination: Secondary | ICD-10-CM | POA: Diagnosis present

## 2024-05-11 DIAGNOSIS — E1069 Type 1 diabetes mellitus with other specified complication: Secondary | ICD-10-CM | POA: Diagnosis not present

## 2024-05-11 DIAGNOSIS — M5010 Cervical disc disorder with radiculopathy, unspecified cervical region: Secondary | ICD-10-CM | POA: Diagnosis not present

## 2024-05-11 DIAGNOSIS — Z01818 Encounter for other preprocedural examination: Secondary | ICD-10-CM | POA: Diagnosis not present

## 2024-05-11 DIAGNOSIS — E785 Hyperlipidemia, unspecified: Secondary | ICD-10-CM | POA: Insufficient documentation

## 2024-05-11 DIAGNOSIS — Z01810 Encounter for preprocedural cardiovascular examination: Secondary | ICD-10-CM | POA: Diagnosis not present

## 2024-05-11 HISTORY — DX: Other complications of anesthesia, initial encounter: T88.59XA

## 2024-05-11 LAB — CBC
HCT: 43.7 % (ref 39.0–52.0)
Hemoglobin: 14.9 g/dL (ref 13.0–17.0)
MCH: 31 pg (ref 26.0–34.0)
MCHC: 34.1 g/dL (ref 30.0–36.0)
MCV: 90.9 fL (ref 80.0–100.0)
Platelets: 280 K/uL (ref 150–400)
RBC: 4.81 MIL/uL (ref 4.22–5.81)
RDW: 13.2 % (ref 11.5–15.5)
WBC: 6.5 K/uL (ref 4.0–10.5)
nRBC: 0 % (ref 0.0–0.2)

## 2024-05-11 LAB — TYPE AND SCREEN
ABO/RH(D): A POS
Antibody Screen: NEGATIVE

## 2024-05-11 LAB — BASIC METABOLIC PANEL WITH GFR
Anion gap: 8 (ref 5–15)
BUN: 19 mg/dL (ref 6–20)
CO2: 28 mmol/L (ref 22–32)
Calcium: 9.1 mg/dL (ref 8.9–10.3)
Chloride: 103 mmol/L (ref 98–111)
Creatinine, Ser: 1.18 mg/dL (ref 0.61–1.24)
GFR, Estimated: >60 mL/min (ref >60)
Glucose, Bld: 174 mg/dL — ABNORMAL HIGH (ref 70–99)
Potassium: 4 mmol/L (ref 3.5–5.1)
Sodium: 139 mmol/L (ref 135–145)

## 2024-05-11 LAB — SURGICAL PCR SCREEN
MRSA, PCR: NEGATIVE
Staphylococcus aureus: NEGATIVE

## 2024-05-11 LAB — HEMOGLOBIN A1C
Hgb A1c MFr Bld: 7.5 % — ABNORMAL HIGH (ref 4.8–5.6)
Mean Plasma Glucose: 168.55 mg/dL

## 2024-05-11 NOTE — Patient Instructions (Addendum)
 Your procedure is scheduled on: Tuesday 05/18/24  Report to the Registration Desk on the 1st floor of the [facility]. To find out your arrival time, please call [phone] between 1PM - 3PM on: Monday 05/17/24  If your arrival time is 6:00 am, do not arrive before that time as the [facility] entrance doors do not open until 6:00 am.  REMEMBER: Instructions that are not followed completely may result in serious medical risk, up to and including death; or upon the discretion of your surgeon and anesthesiologist your surgery may need to be rescheduled.  Do not eat food after midnight the night before surgery.  No gum chewing or hard candies.  You may however, drink CLEAR liquids up to 2 hours before you are scheduled to arrive for your surgery. Do not drink anything within 2 hours of your scheduled arrival time.  Clear liquids include: - water  Do NOT drink anything that is not on this list.   One week prior to surgery: Stop Anti-inflammatories (NSAIDS) such as Advil, Aleve, Ibuprofen, Motrin, Naproxen, Naprosyn and Aspirin based products such as Excedrin, Goody's Powder, BC Powder. Stop ANY OVER THE COUNTER supplements until after surgery.  You may however, continue to take Tylenol if needed for pain up until the day of surgery.  Take  your normal dose of Insulin  Degludec FlexTouch 100 UNIT/ML SOPN the night before but no insulin  the morning of your surgery.    Continue taking all of your other prescription medications up until the day of surgery.  ON THE DAY OF SURGERY ONLY TAKE THESE MEDICATIONS WITH SIPS OF WATER:  ALPRAZolam  (XANAX ) 1 MG  gabapentin (NEURONTIN) 800 MG  baclofen  (LIORESAL ) 20 MG    No Alcohol for 24 hours before or after surgery.  No Smoking including e-cigarettes for 24 hours before surgery.  No chewable tobacco products for at least 6 hours before surgery.  No nicotine patches on the day of surgery.  Do not use any recreational drugs for at  least a week (preferably 2 weeks) before your surgery.  Please be advised that the combination of cocaine and anesthesia may have negative outcomes, up to and including death. If you test positive for cocaine, your surgery will be cancelled.  On the morning of surgery brush your teeth with toothpaste and water, you may rinse your mouth with mouthwash if you wish. Do not swallow any toothpaste or mouthwash.  Use CHG Soap or wipes as directed on instruction sheet.  Do not wear jewelry, make-up, hairpins, clips or nail polish.  For welded (permanent) jewelry: bracelets, anklets, waist bands, etc.  Please have this removed prior to surgery.  If it is not removed, there is a chance that hospital personnel will need to cut it off on the day of surgery.  Do not wear lotions, powders, or perfumes.   Do not shave body hair from the neck down 48 hours hours before surgery.  Contact lenses, hearing aids and dentures may not be worn into surgery.  Do not bring valuables to the hospital. [facility] is not responsible for any missing/lost belongings or valuables.   Notify your doctor if there is any change in your medical condition (cold, fever, infection).  Wear comfortable clothing (specific to your surgery type) to the hospital.  After surgery, you can help prevent lung complications by doing breathing exercises.  Take deep breaths and cough every 1-2 hours. Your doctor may order a device called an Incentive Spirometer to help you take deep  breaths. When coughing or sneezing, hold a pillow firmly against your incision with both hands. This is called "splinting." Doing this helps protect your incision. It also decreases belly discomfort.  If you are being admitted to the hospital overnight, leave your suitcase in the car. After surgery it may be brought to your room.  In case of increased patient census, it may be necessary for you, the patient, to continue your postoperative care in the Same Day  Surgery department.  If you are being discharged the day of surgery, you will not be allowed to drive home. You will need a responsible individual to drive you home and stay with you for 24 hours after surgery.   If you are taking public transportation, you will need to have a responsible individual with you.  Please call the Pre-admissions Testing Dept. at [phone] if you have any questions about these instructions.  Surgery Visitation Policy:  Patients having surgery or a procedure may have two visitors.  Children under the age of 13 must have an adult with them who is not the patient.  Inpatient Visitation:    Visiting hours are 7 a.m. to 8 p.m. Up to four visitors are allowed at one time in a patient room. The visitors may rotate out with other people during the day.  One visitor age 83 or older may stay with the patient overnight and must be in the room by 8 p.m.   Merchandiser, retail to address health-related social needs:  [email or web address]    Pre-operative 4 CHG Bath Instructions   You can play a key role in reducing the risk of infection after surgery. Your skin needs to be as free of germs as possible. You can reduce the number of germs on your skin by washing with CHG (chlorhexidine gluconate) soap before surgery. CHG is an antiseptic soap that kills germs and continues to kill germs even after washing.   DO NOT use if you have an allergy to chlorhexidine/CHG or antibacterial soaps. If your skin becomes reddened or irritated, stop using the CHG and notify one of our RNs at [phone].   Please shower with the CHG soap starting 4 days before surgery using the following schedule:     Please keep in mind the following:  DO NOT shave, including legs and underarms, starting the day of your first shower.   You may shave your face at any point before/day of surgery.  Place clean sheets on your bed the day you start using CHG soap. Use a clean  washcloth (not used since being washed) for each shower. DO NOT sleep with pets once you start using the CHG.   CHG Shower Instructions:  If you choose to wash your hair and private area, wash first with your normal shampoo/soap.  After you use shampoo/soap, rinse your hair and body thoroughly to remove shampoo/soap residue.  Turn the water OFF and apply about 3 tablespoons (45 ml) of CHG soap to a CLEAN washcloth.  Apply CHG soap ONLY FROM YOUR NECK DOWN TO YOUR TOES (washing for 3-5 minutes)  DO NOT use CHG soap on face, private areas, open wounds, or sores.  Pay special attention to the area where your surgery is being performed.  If you are having back surgery, having someone wash your back for you may be helpful. Wait 2 minutes after CHG soap is applied, then you may rinse off the CHG soap.  Pat dry with a clean towel  Put on clean  clothes/pajamas   If you choose to wear lotion, please use ONLY the CHG-compatible lotions on the back of this paper.     Additional instructions for the day of surgery: DO NOT APPLY any lotions, deodorants, cologne, or perfumes.   Put on clean/comfortable clothes.  Brush your teeth.  Ask your nurse before applying any prescription medications to the skin.      CHG Compatible Lotions   Aveeno Moisturizing lotion  Cetaphil Moisturizing Cream  Cetaphil Moisturizing Lotion  Clairol Herbal Essence Moisturizing Lotion, Dry Skin  Clairol Herbal Essence Moisturizing Lotion, Extra Dry Skin  Clairol Herbal Essence Moisturizing Lotion, Normal Skin  Curel Age Defying Therapeutic Moisturizing Lotion with Alpha Hydroxy  Curel Extreme Care Body Lotion  Curel Soothing Hands Moisturizing Hand Lotion  Curel Therapeutic Moisturizing Cream, Fragrance-Free  Curel Therapeutic Moisturizing Lotion, Fragrance-Free  Curel Therapeutic Moisturizing Lotion, Original Formula  Eucerin Daily Replenishing Lotion  Eucerin Dry Skin Therapy Plus Alpha Hydroxy Crme  Eucerin  Dry Skin Therapy Plus Alpha Hydroxy Lotion  Eucerin Original Crme  Eucerin Original Lotion  Eucerin Plus Crme Eucerin Plus Lotion  Eucerin TriLipid Replenishing Lotion  Keri Anti-Bacterial Hand Lotion  Keri Deep Conditioning Original Lotion Dry Skin Formula Softly Scented  Keri Deep Conditioning Original Lotion, Fragrance Free Sensitive Skin Formula  Keri Lotion Fast Absorbing Fragrance Free Sensitive Skin Formula  Keri Lotion Fast Absorbing Softly Scented Dry Skin Formula  Keri Original Lotion  Keri Skin Renewal Lotion Keri Silky Smooth Lotion  Keri Silky Smooth Sensitive Skin Lotion  Nivea Body Creamy Conditioning Oil  Nivea Body Extra Enriched Lotion  Nivea Body Original Lotion  Nivea Body Sheer Moisturizing Lotion Nivea Crme  Nivea Skin Firming Lotion  NutraDerm 30 Skin Lotion  NutraDerm Skin Lotion  NutraDerm Therapeutic Skin Cream  NutraDerm Therapeutic Skin Lotion  ProShield Protective Hand Cream  Provon moisturizing lotion  How to Use an Incentive Spirometer  An incentive spirometer is a tool that measures how well you are filling your lungs with each breath. Learning to take long, deep breaths using this tool can help you keep your lungs clear and active. This may help to reverse or lessen your chance of developing breathing (pulmonary) problems, especially infection. You may be asked to use a spirometer: After a surgery. If you have a lung problem or a history of smoking. After a long period of time when you have been unable to move or be active. If the spirometer includes an indicator to show the highest number that you have reached, your health care provider or respiratory therapist will help you set a goal. Keep a log of your progress as told by your health care provider. What are the risks? Breathing too quickly may cause dizziness or cause you to pass out. Take your time so you do not get dizzy or light-headed. If you are in pain, you may need to take pain medicine  before doing incentive spirometry. It is harder to take a deep breath if you are having pain. How to use your incentive spirometer  Sit up on the edge of your bed or on a chair. Hold the incentive spirometer so that it is in an upright position. Before you use the spirometer, breathe out normally. Place the mouthpiece in your mouth. Make sure your lips are closed tightly around it. Breathe in slowly and as deeply as you can through your mouth, causing the piston or the ball to rise toward the top of the chamber. Hold your breath for  3-5 seconds, or for as long as possible. If the spirometer includes a coach indicator, use this to guide you in breathing. Slow down your breathing if the indicator goes above the marked areas. Remove the mouthpiece from your mouth and breathe out normally. The piston or ball will return to the bottom of the chamber. Rest for a few seconds, then repeat the steps 10 or more times. Take your time and take a few normal breaths between deep breaths so that you do not get dizzy or light-headed. Do this every 1-2 hours when you are awake. If the spirometer includes a goal marker to show the highest number you have reached (best effort), use this as a goal to work toward during each repetition. After each set of 10 deep breaths, cough a few times. This will help to make sure that your lungs are clear. If you have an incision on your chest or abdomen from surgery, place a pillow or a rolled-up towel firmly against the incision when you cough. This can help to reduce pain while taking deep breaths and coughing. General tips When you are able to get out of bed: Walk around often. Continue to take deep breaths and cough in order to clear your lungs. Keep using the incentive spirometer until your health care provider says it is okay to stop using it. If you have been in the hospital, you may be told to keep using the spirometer at home. Contact a health care provider if: You  are having difficulty using the spirometer. You have trouble using the spirometer as often as instructed. Your pain medicine is not giving enough relief for you to use the spirometer as told. You have a fever. Get help right away if: You develop shortness of breath. You develop a cough with bloody mucus from the lungs. You have fluid or blood coming from an incision site after you cough. Summary An incentive spirometer is a tool that can help you learn to take long, deep breaths to keep your lungs clear and active. You may be asked to use a spirometer after a surgery, if you have a lung problem or a history of smoking, or if you have been inactive for a long period of time. Use your incentive spirometer as instructed every 1-2 hours while you are awake. If you have an incision on your chest or abdomen, place a pillow or a rolled-up towel firmly against your incision when you cough. This will help to reduce pain. Get help right away if you have shortness of breath, you cough up bloody mucus, or blood comes from your incision when you cough. This information is not intended to replace advice given to you by your health care provider. Make sure you discuss any questions you have with your health care provider. Document Revised: 10/04/2019 Document Reviewed: 10/04/2019 Elsevier Patient Education  2023 [employer].

## 2024-05-13 NOTE — Telephone Encounter (Signed)
 [facility] PCP completed and signed STD forms. They have been faxed to [facility] at fax number [phone]. Patient has been contacted and informed they are complete. Copy at front desk.

## 2024-05-15 ENCOUNTER — Other Ambulatory Visit: Payer: Self-pay | Admitting: Family Medicine

## 2024-05-15 DIAGNOSIS — E1069 Type 1 diabetes mellitus with other specified complication: Secondary | ICD-10-CM

## 2024-05-18 ENCOUNTER — Encounter: Payer: Self-pay | Admitting: Neurosurgery

## 2024-05-18 ENCOUNTER — Observation Stay
Admission: RE | Admit: 2024-05-18 | Discharge: 2024-05-19 | Disposition: A | Discharge status: Home / Self Care | Attending: Neurosurgery | Admitting: Neurosurgery

## 2024-05-18 ENCOUNTER — Other Ambulatory Visit: Payer: Self-pay

## 2024-05-18 ENCOUNTER — Ambulatory Visit: Payer: Self-pay | Admitting: Urgent Care

## 2024-05-18 ENCOUNTER — Ambulatory Visit

## 2024-05-18 ENCOUNTER — Encounter
Admission: RE | Disposition: A | Discharge status: Home / Self Care | Payer: Self-pay | Source: Home / Self Care | Attending: Neurosurgery

## 2024-05-18 DIAGNOSIS — Z01818 Encounter for other preprocedural examination: Secondary | ICD-10-CM

## 2024-05-18 DIAGNOSIS — M50123 Cervical disc disorder at C6-C7 level with radiculopathy: Secondary | ICD-10-CM | POA: Insufficient documentation

## 2024-05-18 DIAGNOSIS — Z79899 Other long term (current) drug therapy: Secondary | ICD-10-CM | POA: Diagnosis not present

## 2024-05-18 DIAGNOSIS — R208 Other disturbances of skin sensation: Secondary | ICD-10-CM

## 2024-05-18 DIAGNOSIS — M50223 Other cervical disc displacement at C6-C7 level: Secondary | ICD-10-CM | POA: Diagnosis not present

## 2024-05-18 DIAGNOSIS — M50122 Cervical disc disorder at C5-C6 level with radiculopathy: Secondary | ICD-10-CM | POA: Insufficient documentation

## 2024-05-18 DIAGNOSIS — M5412 Radiculopathy, cervical region: Secondary | ICD-10-CM | POA: Diagnosis present

## 2024-05-18 DIAGNOSIS — M6281 Muscle weakness (generalized): Secondary | ICD-10-CM | POA: Diagnosis present

## 2024-05-18 DIAGNOSIS — M50222 Other cervical disc displacement at C5-C6 level: Secondary | ICD-10-CM | POA: Diagnosis not present

## 2024-05-18 DIAGNOSIS — E109 Type 1 diabetes mellitus without complications: Secondary | ICD-10-CM | POA: Insufficient documentation

## 2024-05-18 DIAGNOSIS — R200 Anesthesia of skin: Secondary | ICD-10-CM | POA: Insufficient documentation

## 2024-05-18 DIAGNOSIS — F17210 Nicotine dependence, cigarettes, uncomplicated: Secondary | ICD-10-CM | POA: Diagnosis not present

## 2024-05-18 DIAGNOSIS — Z01812 Encounter for preprocedural laboratory examination: Secondary | ICD-10-CM

## 2024-05-18 DIAGNOSIS — E1069 Type 1 diabetes mellitus with other specified complication: Secondary | ICD-10-CM

## 2024-05-18 DIAGNOSIS — M4722 Other spondylosis with radiculopathy, cervical region: Secondary | ICD-10-CM | POA: Diagnosis present

## 2024-05-18 DIAGNOSIS — M5010 Cervical disc disorder with radiculopathy, unspecified cervical region: Principal | ICD-10-CM | POA: Diagnosis present

## 2024-05-18 HISTORY — PX: CERVICAL ANTERIOR DISC ARTHROPLASTY, 2 LEVEL: SHX7538

## 2024-05-18 LAB — GLUCOSE, CAPILLARY
Glucose-Capillary: 82 mg/dL (ref 70–99)
Glucose-Capillary: 177 mg/dL — ABNORMAL HIGH (ref 70–99)
Glucose-Capillary: 312 mg/dL — ABNORMAL HIGH (ref 70–99)

## 2024-05-18 LAB — ABO/RH: ABO/RH(D): A POS

## 2024-05-18 SURGERY — CERVICAL ANTERIOR DISC ARTHROPLASTY, 2 LEVEL
Anesthesia: General | Site: Spine Cervical

## 2024-05-18 MED ORDER — ACETAMINOPHEN 650 MG RE SUPP: 650.0000 mg | RECTAL | Status: DC | PRN

## 2024-05-18 MED ORDER — CEFAZOLIN IN SODIUM CHLORIDE 2-0.9 GM/100ML-% IV SOLN
2.0000 g | Freq: Once | INTRAVENOUS | Status: AC
  Administered 2024-05-18: 2 g via INTRAVENOUS

## 2024-05-18 MED ORDER — ENOXAPARIN SODIUM 40 MG/0.4ML IJ SOSY
40.0000 mg | PREFILLED_SYRINGE | INTRAMUSCULAR | Status: DC
  Administered 2024-05-19: 40 mg via SUBCUTANEOUS
  Filled 2024-05-18: qty 0.4

## 2024-05-18 MED ORDER — SURGIFLO WITH THROMBIN (HEMOSTATIC MATRIX KIT) OPTIME
TOPICAL | Status: DC | PRN
  Administered 2024-05-18: 1 via TOPICAL

## 2024-05-18 MED ORDER — DEXAMETHASONE SOD PHOSPHATE PF 10 MG/ML IJ SOLN
INTRAMUSCULAR | Status: DC | PRN
  Administered 2024-05-18: 10 mg via INTRAVENOUS

## 2024-05-18 MED ORDER — ACETAMINOPHEN 325 MG PO TABS: 650.0000 mg | ORAL_TABLET | ORAL | Status: DC | PRN

## 2024-05-18 MED ORDER — FENTANYL CITRATE (PF) 100 MCG/2ML IJ SOLN
INTRAMUSCULAR | Status: AC
  Filled 2024-05-18: qty 2

## 2024-05-18 MED ORDER — ACETAMINOPHEN 500 MG PO TABS
1000.0000 mg | ORAL_TABLET | Freq: Four times a day (QID) | ORAL | Status: DC
  Administered 2024-05-18 – 2024-05-19 (×3): 1000 mg via ORAL
  Filled 2024-05-18 (×3): qty 2

## 2024-05-18 MED ORDER — OXYCODONE HCL 5 MG PO TABS: 5.0000 mg | ORAL_TABLET | ORAL | Status: DC | PRN

## 2024-05-18 MED ORDER — FENTANYL CITRATE (PF) 100 MCG/2ML IJ SOLN
INTRAMUSCULAR | Status: DC | PRN
  Administered 2024-05-18: 100 ug via INTRAVENOUS

## 2024-05-18 MED ORDER — KETAMINE HCL 50 MG/5ML IJ SOSY
PREFILLED_SYRINGE | INTRAMUSCULAR | Status: DC | PRN
  Administered 2024-05-18 (×2): 20 mg via INTRAVENOUS

## 2024-05-18 MED ORDER — SENNA 8.6 MG PO TABS
1.0000 | ORAL_TABLET | Freq: Two times a day (BID) | ORAL | Status: DC
  Administered 2024-05-18 – 2024-05-19 (×2): 8.6 mg via ORAL
  Filled 2024-05-18 (×2): qty 1

## 2024-05-18 MED ORDER — OXYCODONE HCL 5 MG/5ML PO SOLN: 5.0000 mg | Freq: Once | ORAL | Status: DC | PRN

## 2024-05-18 MED ORDER — CHLORHEXIDINE GLUCONATE 0.12 % MT SOLN
OROMUCOSAL | Status: AC
  Filled 2024-05-18: qty 15

## 2024-05-18 MED ORDER — CHLORHEXIDINE GLUCONATE 0.12 % MT SOLN
15.0000 mL | Freq: Once | OROMUCOSAL | Status: AC
  Administered 2024-05-18: 15 mL via OROMUCOSAL

## 2024-05-18 MED ORDER — 0.9 % SODIUM CHLORIDE (POUR BTL) OPTIME
TOPICAL | Status: DC | PRN
  Administered 2024-05-18: 500 mL

## 2024-05-18 MED ORDER — GABAPENTIN 400 MG PO CAPS
800.0000 mg | ORAL_CAPSULE | Freq: Three times a day (TID) | ORAL | Status: DC
  Administered 2024-05-18 – 2024-05-19 (×2): 800 mg via ORAL
  Filled 2024-05-18: qty 8
  Filled 2024-05-18: qty 2

## 2024-05-18 MED ORDER — INSULIN ASPART 100 UNIT/ML IJ SOLN
0.0000 [IU] | Freq: Every day | INTRAMUSCULAR | Status: DC
  Administered 2024-05-18: 4 [IU] via SUBCUTANEOUS
  Filled 2024-05-18: qty 1

## 2024-05-18 MED ORDER — SORBITOL 70 % SOLN: 30.0000 mL | Freq: Every day | Status: DC | PRN

## 2024-05-18 MED ORDER — SODIUM CHLORIDE 0.9 % IV SOLN: 250.0000 mL | INTRAVENOUS | Status: DC

## 2024-05-18 MED ORDER — ROCURONIUM BROMIDE 100 MG/10ML IV SOLN
INTRAVENOUS | Status: DC | PRN
  Administered 2024-05-18: 20 mg via INTRAVENOUS
  Administered 2024-05-18: 60 mg via INTRAVENOUS

## 2024-05-18 MED ORDER — HYDROMORPHONE HCL 1 MG/ML IJ SOLN
INTRAMUSCULAR | Status: AC
  Filled 2024-05-18: qty 1

## 2024-05-18 MED ORDER — KETOROLAC TROMETHAMINE 15 MG/ML IJ SOLN
7.5000 mg | Freq: Four times a day (QID) | INTRAMUSCULAR | Status: AC
  Administered 2024-05-18 – 2024-05-19 (×4): 7.5 mg via INTRAVENOUS
  Filled 2024-05-18 (×3): qty 1

## 2024-05-18 MED ORDER — LIDOCAINE HCL (CARDIAC) PF 100 MG/5ML IV SOSY
PREFILLED_SYRINGE | INTRAVENOUS | Status: DC | PRN
  Administered 2024-05-18: 100 mg via INTRAVENOUS

## 2024-05-18 MED ORDER — ALPRAZOLAM 1 MG PO TABS
1.0000 mg | ORAL_TABLET | Freq: Two times a day (BID) | ORAL | Status: DC | PRN
Start: 2024-05-18 — End: 2024-05-19

## 2024-05-18 MED ORDER — FENTANYL CITRATE (PF) 100 MCG/2ML IJ SOLN
25.0000 ug | INTRAMUSCULAR | Status: DC | PRN
  Administered 2024-05-18 (×3): 50 ug via INTRAVENOUS

## 2024-05-18 MED ORDER — BUPIVACAINE-EPINEPHRINE (PF) 0.5% -1:200000 IJ SOLN
INTRAMUSCULAR | Status: DC | PRN
  Administered 2024-05-18: 10 mL via PERINEURAL

## 2024-05-18 MED ORDER — PRAVASTATIN SODIUM 20 MG PO TABS
40.0000 mg | ORAL_TABLET | Freq: Every day | ORAL | Status: DC
  Administered 2024-05-18 – 2024-05-19 (×2): 40 mg via ORAL
  Filled 2024-05-18 (×2): qty 2

## 2024-05-18 MED ORDER — OXYCODONE HCL 5 MG PO TABS
10.0000 mg | ORAL_TABLET | ORAL | Status: DC | PRN
  Administered 2024-05-18 – 2024-05-19 (×3): 10 mg via ORAL
  Filled 2024-05-18 (×2): qty 2

## 2024-05-18 MED ORDER — ONDANSETRON HCL 4 MG PO TABS: 4.0000 mg | ORAL_TABLET | Freq: Four times a day (QID) | ORAL | Status: DC | PRN

## 2024-05-18 MED ORDER — ONDANSETRON HCL 4 MG/2ML IJ SOLN: 4.0000 mg | Freq: Four times a day (QID) | INTRAMUSCULAR | Status: DC | PRN

## 2024-05-18 MED ORDER — OXYCODONE HCL 5 MG PO TABS
ORAL_TABLET | ORAL | Status: AC
  Filled 2024-05-18: qty 2

## 2024-05-18 MED ORDER — MORPHINE SULFATE ER 15 MG PO TBCR
15.0000 mg | EXTENDED_RELEASE_TABLET | Freq: Two times a day (BID) | ORAL | Status: DC
Start: 2024-05-18 — End: 2024-05-19
  Administered 2024-05-18 – 2024-05-19 (×2): 15 mg via ORAL
  Filled 2024-05-18 (×2): qty 1

## 2024-05-18 MED ORDER — DOCUSATE SODIUM 100 MG PO CAPS
100.0000 mg | ORAL_CAPSULE | Freq: Two times a day (BID) | ORAL | Status: DC
  Administered 2024-05-18 – 2024-05-19 (×2): 100 mg via ORAL
  Filled 2024-05-18 (×2): qty 1

## 2024-05-18 MED ORDER — ACETAMINOPHEN 10 MG/ML IV SOLN
INTRAVENOUS | Status: DC | PRN
  Administered 2024-05-18: 1000 mg via INTRAVENOUS

## 2024-05-18 MED ORDER — ORAL CARE MOUTH RINSE: 15.0000 mL | Freq: Once | OROMUCOSAL | Status: AC

## 2024-05-18 MED ORDER — OXYCODONE HCL 5 MG PO TABS: 5.0000 mg | ORAL_TABLET | Freq: Once | ORAL | Status: DC | PRN

## 2024-05-18 MED ORDER — PHENOL 1.4 % MT LIQD
1.0000 | OROMUCOSAL | Status: DC | PRN
  Filled 2024-05-18: qty 177

## 2024-05-18 MED ORDER — MENTHOL 3 MG MT LOZG: 1.0000 | LOZENGE | OROMUCOSAL | Status: DC | PRN

## 2024-05-18 MED ORDER — MIDAZOLAM HCL (PF) 2 MG/2ML IJ SOLN
INTRAMUSCULAR | Status: DC | PRN
  Administered 2024-05-18: 2 mg via INTRAVENOUS

## 2024-05-18 MED ORDER — PHENYLEPHRINE 80 MCG/ML (10ML) SYRINGE FOR IV PUSH (FOR BLOOD PRESSURE SUPPORT)
PREFILLED_SYRINGE | INTRAVENOUS | Status: DC | PRN
  Administered 2024-05-18: 160 ug via INTRAVENOUS

## 2024-05-18 MED ORDER — KETOROLAC TROMETHAMINE 15 MG/ML IJ SOLN
INTRAMUSCULAR | Status: AC
  Filled 2024-05-18: qty 1

## 2024-05-18 MED ORDER — PROPOFOL 10 MG/ML IV BOLUS
INTRAVENOUS | Status: DC | PRN
  Administered 2024-05-18: 200 mg via INTRAVENOUS

## 2024-05-18 MED ORDER — SODIUM CHLORIDE 0.9 % IV SOLN: INTRAVENOUS | Status: DC

## 2024-05-18 MED ORDER — MIDAZOLAM HCL 2 MG/2ML IJ SOLN
INTRAMUSCULAR | Status: AC
  Filled 2024-05-18: qty 2

## 2024-05-18 MED ORDER — SODIUM CHLORIDE 0.9% FLUSH: 3.0000 mL | INTRAVENOUS | Status: DC | PRN

## 2024-05-18 MED ORDER — ONDANSETRON HCL 4 MG/2ML IJ SOLN
INTRAMUSCULAR | Status: DC | PRN
  Administered 2024-05-18: 4 mg via INTRAVENOUS

## 2024-05-18 MED ORDER — SUGAMMADEX SODIUM 200 MG/2ML IV SOLN
INTRAVENOUS | Status: DC | PRN
  Administered 2024-05-18: 200 mg via INTRAVENOUS

## 2024-05-18 MED ORDER — BACLOFEN 10 MG PO TABS: 20.0000 mg | ORAL_TABLET | Freq: Two times a day (BID) | ORAL | Status: DC | PRN

## 2024-05-18 MED ORDER — SODIUM CHLORIDE 0.9% FLUSH
3.0000 mL | Freq: Two times a day (BID) | INTRAVENOUS | Status: DC
  Administered 2024-05-18 – 2024-05-19 (×2): 3 mL via INTRAVENOUS

## 2024-05-18 MED ORDER — POLYETHYLENE GLYCOL 3350 17 G PO PACK: 17.0000 g | PACK | Freq: Every day | ORAL | Status: DC | PRN

## 2024-05-18 MED ORDER — INSULIN ASPART 100 UNIT/ML IJ SOLN
0.0000 [IU] | Freq: Three times a day (TID) | INTRAMUSCULAR | Status: DC
  Administered 2024-05-19: 15 [IU] via SUBCUTANEOUS
  Administered 2024-05-19: 11 [IU] via SUBCUTANEOUS
  Filled 2024-05-18 (×2): qty 1

## 2024-05-18 MED ORDER — MAGNESIUM CITRATE PO SOLN: 1.0000 | Freq: Once | ORAL | Status: DC | PRN

## 2024-05-18 MED ORDER — CEFAZOLIN SODIUM-DEXTROSE 2-4 GM/100ML-% IV SOLN
INTRAVENOUS | Status: AC
  Filled 2024-05-18: qty 100

## 2024-05-18 MED ORDER — HYDROMORPHONE HCL 1 MG/ML IJ SOLN
0.5000 mg | INTRAMUSCULAR | Status: AC | PRN
  Administered 2024-05-18: 0.5 mg via INTRAVENOUS

## 2024-05-18 SURGICAL SUPPLY — 28 items
BUR NEURO DRILL SOFT 3.0X3.8M (BURR) ×1 IMPLANT
DERMABOND ADVANCED .7 DNX12 (GAUZE/BANDAGES/DRESSINGS) ×1 IMPLANT
DISC MOBI-C CERV STD 15X17 H4. (Miscellaneous) IMPLANT
DRAPE C-ARM XRAY 36X54 (DRAPES) ×2 IMPLANT
DRAPE LAPAROTOMY 77X122 PED (DRAPES) ×1 IMPLANT
DRAPE SPINE LEICA/WILD 54X150 (DRAPES) ×1 IMPLANT
DRAPE SURG 17X11 SM STRL (DRAPES) ×1 IMPLANT
ELECTRODE CAUTERY BLDE TIP 2.5 (TIP) ×1 IMPLANT
ELECTRODE REM PT RTRN 9FT ADLT (ELECTROSURGICAL) ×1 IMPLANT
GLOVE BIOGEL PI IND STRL 7.0 (GLOVE) ×1 IMPLANT
GLOVE BIOGEL PI IND STRL 8 (GLOVE) ×1 IMPLANT
GLOVE SURG SYN 7.0 PF PI (GLOVE) ×1 IMPLANT
GLOVE SURG SYN 7.5 PF PI (GLOVE) ×2 IMPLANT
GOWN SRG LRG LVL 4 IMPRV REINF (GOWNS) ×1 IMPLANT
GOWN SRG XL LVL 3 NONREINFORCE (GOWNS) ×1 IMPLANT
KIT TURNOVER KIT A (KITS) ×1 IMPLANT
MANIFOLD NEPTUNE II (INSTRUMENTS) ×1 IMPLANT
NS IRRIG 500ML POUR BTL (IV SOLUTION) ×1 IMPLANT
PACK LAMINECTOMY ARMC (PACKS) ×1 IMPLANT
PAD ARMBOARD POSITIONER FOAM (MISCELLANEOUS) ×1 IMPLANT
PIN CASPAR SPINAL 14MM CERVICA (PIN) IMPLANT
SPONGE KITTNER 5P (MISCELLANEOUS) ×1 IMPLANT
SURGIFLO W/THROMBIN 8M KIT (HEMOSTASIS) ×1 IMPLANT
SUT STRATA 3-0 15 PS-2 (SUTURE) ×1 IMPLANT
SUT VICRYL 3-0 CR8 SH (SUTURE) ×1 IMPLANT
SYR 20ML LL LF (SYRINGE) ×1 IMPLANT
TAPE CLOTH 3X10 WHT NS LF (GAUZE/BANDAGES/DRESSINGS) ×2 IMPLANT
TRAP FLUID SMOKE EVACUATOR (MISCELLANEOUS) ×1 IMPLANT

## 2024-05-18 NOTE — Anesthesia Postprocedure Evaluation (Signed)
 Anesthesia Post Note  Patient: Gary Stone  Procedure(s) Performed: C5-C7 anterior cervical disc arthroplasty (Spine Cervical)  Patient location during evaluation: PACU Anesthesia Type: General Level of consciousness: awake and alert Pain management: pain level controlled Vital Signs Assessment: post-procedure vital signs reviewed and stable Respiratory status: spontaneous breathing, nonlabored ventilation, respiratory function stable and patient connected to nasal cannula oxygen Cardiovascular status: blood pressure returned to baseline and stable Postop Assessment: no apparent nausea or vomiting Anesthetic complications: no   No notable events documented.   Last Vitals:  Vitals:   05/18/24 1256  BP: (!) 163/99  Pulse: 96  Resp: 16  Temp: 36.7 C  SpO2: 96%    Last Pain:  Vitals:   05/18/24 1256  TempSrc: Temporal  PainSc: 8                  Lynwood KANDICE Clause

## 2024-05-18 NOTE — Interval H&P Note (Signed)
 History and Physical Interval Note:  05/18/2024 2:40 PM  Gary Stone  has presented today for surgery, with the diagnosis of C5-C7 cervical spondylosis causing severe radiculopathy.  The various methods of treatment have been discussed with the patient and family. After consideration of risks, benefits and other options for treatment, the patient has consented to  Procedure(s): C5-C7 anterior cervical disc arthroplasty (N/A) as a surgical intervention.  The patient's history has been reviewed, patient examined, no change in status, stable for surgery.  I have reviewed the patient's chart and labs.  Questions were answered to the patient's satisfaction.    Heart and lungs are clear.  [name]

## 2024-05-18 NOTE — Plan of Care (Signed)
 Verbalizes understanding of plan of care, discharge instructions

## 2024-05-18 NOTE — Discharge Instructions (Signed)
 Your surgeon has performed an operation on your cervical spine (neck) to relieve pressure on the spinal cord and/or nerves. This involved making an incision in the front of your neck and removing one or more of the discs that support your spine. Next, a small piece of bone, a titanium plate, and screws were used to fuse two or more of the vertebrae (bones) together.  The following are instructions to help in your recovery once you have been discharged from the hospital. Even if you feel well, it is important that you follow these activity guidelines. If you do not let your neck heal properly from the surgery, you can increase the chance of return of your symptoms and other complications.  * ok to take anti-inflammatory medications  *Regarding compression stockings-  Please wear day and night until you are walking a couple hundred feet three times a day.   Activity    No bending, lifting, or twisting ("BLT"). Avoid lifting objects heavier than 10 pounds (gallon milk jug).  Where possible, avoid household activities that involve lifting, bending, reaching, pushing, or pulling such as laundry, vacuuming, grocery shopping, and childcare. Try to arrange for help from friends and family for these activities while your back heals.  Increase physical activity slowly as tolerated.  Taking short walks is encouraged, but avoid strenuous exercise. Do not jog, run, bicycle, lift weights, or participate in any other exercises unless specifically allowed by your doctor.  Talk to your doctor before resuming sexual activity.  You should not drive until cleared by your doctor.  Until released by your doctor, you should not return to work or school.  You should rest at home and let your body heal.   You may shower three days after your surgery.  After showering, lightly dab your incision dry. Do not take a tub bath or go swimming until approved by your doctor at your follow-up appointment.  If your doctor  ordered a cervical collar (neck brace) for you, you should wear it whenever you are out of bed. You may remove it when lying down or sleeping, but you should wear it at all other times. Not all neck surgeries require a cervical collar.  If you smoke, we strongly recommend that you quit.  Smoking has been proven to interfere with normal bone healing and will dramatically reduce the success rate of your surgery. Please contact [facility] (800-QUIT-NOW) and use the resources at www.[facility].com for assistance in stopping smoking.  Surgical Incision   If you have a dressing on your incision, you may remove it two days after your surgery. Keep your incision area clean and dry.  If you have staples or stitches on your incision, you should have a follow up scheduled for removal. If you do not have staples or stitches, you will have steri-strips (small pieces of surgical tape) or Dermabond glue. The steri-strips/glue should begin to peel away within about a week (it is fine if the steri-strips fall off before then). If the strips are still in place one week after your surgery, you may gently remove them.  Diet           You may return to your usual diet. However, you may experience discomfort when swallowing in the first month after your surgery. This is normal. You may find that softer foods are more comfortable for you to swallow. Be sure to stay hydrated.  When to Contact Us   You may experience pain in your neck and/or pain between your  shoulder blades. This is normal and should improve in the next few weeks with the help of pain medication, muscle relaxers, and rest. Some patients report that a warm compress on the back of the neck or between the shoulder blades helps.  However, should you experience any of the following, contact us  immediately: New numbness or weakness Pain that is progressively getting worse, and is not relieved by your pain medication, muscle relaxers, rest, and warm  compresses Bleeding, redness, swelling, pain, or drainage from surgical incision Chills or flu-like symptoms Fever greater than 101.0 F (38.3 C) Inability to eat, drink fluids, or take medications Problems with bowel or bladder functions Difficulty breathing or shortness of breath Warmth, tenderness, or swelling in your calf Contact Information How to contact us :  If you have any questions/concerns before or after surgery, you can reach us  at [phone], or you can send a mychart message. We can be reached by phone or mychart 8am-4pm, Monday-Friday.  *Please note: Calls after 4pm are forwarded to a third party answering service. Mychart messages are not routinely monitored during evenings, weekends, and holidays. Please call our office to contact the answering service for urgent concerns during non-business hours.

## 2024-05-18 NOTE — Anesthesia Preprocedure Evaluation (Addendum)
 Anesthesia Evaluation  Patient identified by MRN, date of birth, ID band Patient awake    Reviewed: Allergy & Precautions, NPO status , Patient's Chart, lab work & pertinent test results  History of Anesthesia Complications Negative for: history of anesthetic complications  Airway Mallampati: III  TM Distance: >3 FB Neck ROM: full    Dental  (+) Lower Dentures, Upper Dentures   Pulmonary Current Smoker and Patient abstained from smoking.   Pulmonary exam normal        Cardiovascular negative cardio ROS Normal cardiovascular exam     Neuro/Psych  PSYCHIATRIC DISORDERS Anxiety      Neuromuscular disease    GI/Hepatic negative GI ROS, Neg liver ROS,,,  Endo/Other  diabetes, Type 1, Insulin  Dependent    Renal/GU      Musculoskeletal   Abdominal   Peds  Hematology negative hematology ROS (+)   Anesthesia Other Findings Past Medical History: No date: Anxiety No date: Complication of anesthesia No date: Diabetes mellitus without complication (HCC) No date: Metal foreign body in scalp     Comment:  seen on skull xray 04/18/2024  Past Surgical History: 2005: ANKLE SURGERY; Right     Comment:  had 3 surgery back to back within two days No date: DENTAL SURGERY; Bilateral     Comment:  new upper, then did lower teeth removal & dentures     Reproductive/Obstetrics negative OB ROS                              Anesthesia Physical Anesthesia Plan  ASA: 3  Anesthesia Plan: General ETT   Post-op Pain Management: Ofirmev IV (intra-op)*, Toradol IV (intra-op)*, Dilaudid IV and Ketamine IV*   Induction: Intravenous  PONV Risk Score and Plan: 2 and Ondansetron, Dexamethasone, Midazolam and Treatment may vary due to age or medical condition  Airway Management Planned: Oral ETT  Additional Equipment:   Intra-op Plan:   Post-operative Plan: Extubation in OR  Informed Consent: I have  reviewed the patients History and Physical, chart, labs and discussed the procedure including the risks, benefits and alternatives for the proposed anesthesia with the patient or authorized representative who has indicated his/her understanding and acceptance.     Dental Advisory Given  Plan Discussed with: Anesthesiologist, CRNA and Surgeon  Anesthesia Plan Comments: (Patient consented for risks of anesthesia including but not limited to:  - adverse reactions to medications - damage to eyes, teeth, lips or other oral mucosa - nerve damage due to positioning  - sore throat or hoarseness - Damage to heart, brain, nerves, lungs, other parts of body or loss of life  Patient voiced understanding and assent.)         Anesthesia Quick Evaluation

## 2024-05-18 NOTE — Anesthesia Procedure Notes (Signed)
 Procedure Name: Intubation Date/Time: 05/18/2024 3:17 PM  Performed by: Elly Pfeiffer, CRNAPre-anesthesia Checklist: Patient identified, Emergency Drugs available, Suction available and Patient being monitored Patient Re-evaluated:Patient Re-evaluated prior to induction Oxygen Delivery Method: Circle system utilized Preoxygenation: Pre-oxygenation with 100% oxygen Induction Type: IV induction Ventilation: Mask ventilation without difficulty Laryngoscope Size: McGrath and 4 Grade View: Grade I Tube type: Oral Tube size: 7.5 mm Number of attempts: 1 Airway Equipment and Method: Stylet and Oral airway Placement Confirmation: ETT inserted through vocal cords under direct vision, positive ETCO2 and breath sounds checked- equal and bilateral Secured at: 21 cm Tube secured with: Tape Dental Injury: Teeth and Oropharynx as per pre-operative assessment

## 2024-05-18 NOTE — Transfer of Care (Signed)
 Immediate Anesthesia Transfer of Care Note  Patient: Gary Stone  Procedure(s) Performed: C5-C7 anterior cervical disc arthroplasty (Spine Cervical)  Patient Location: PACU  Anesthesia Type:general  Level of Consciousness: drowsy  Airway & Oxygen Therapy: Patient Spontanous Breathing and Patient connected to face mask oxygen  Post-op Assessment: Report given to RN and Post -op Vital signs reviewed and stable  Post vital signs: Reviewed and stable  Last Vitals:  Vitals Value Taken Time  BP 134/65 05/18/24 17:23  Temp    Pulse 96 05/18/24 17:28  Resp 12 05/18/24 17:28  SpO2 98 % 05/18/24 17:28  Vitals shown include unfiled device data.  Last Pain:  Vitals:   05/18/24 1256  TempSrc: Temporal  PainSc: 8          Complications: No notable events documented.

## 2024-05-19 ENCOUNTER — Encounter: Payer: Self-pay | Admitting: Neurosurgery

## 2024-05-19 ENCOUNTER — Other Ambulatory Visit: Payer: Self-pay

## 2024-05-19 DIAGNOSIS — M4722 Other spondylosis with radiculopathy, cervical region: Secondary | ICD-10-CM | POA: Diagnosis not present

## 2024-05-19 LAB — GLUCOSE, CAPILLARY
Glucose-Capillary: 327 mg/dL — ABNORMAL HIGH (ref 70–99)
Glucose-Capillary: 431 mg/dL — ABNORMAL HIGH (ref 70–99)

## 2024-05-19 MED ORDER — BACLOFEN 10 MG PO TABS
20.0000 mg | ORAL_TABLET | Freq: Three times a day (TID) | ORAL | 0 refills | Status: DC
  Filled 2024-05-19: qty 60, 10d supply, fill #0

## 2024-05-19 MED ORDER — SENNA 8.6 MG PO TABS
1.0000 | ORAL_TABLET | Freq: Two times a day (BID) | ORAL | 0 refills | Status: DC | PRN
  Filled 2024-05-19: qty 30, 15d supply, fill #0

## 2024-05-19 MED ORDER — POLYETHYLENE GLYCOL 3350 17 GM/SCOOP PO POWD
17.0000 g | Freq: Every day | ORAL | 0 refills | Status: DC | PRN
  Filled 2024-05-19: qty 238, 14d supply, fill #0

## 2024-05-19 MED ORDER — BACLOFEN 10 MG PO TABS
20.0000 mg | ORAL_TABLET | Freq: Three times a day (TID) | ORAL | Status: DC
  Administered 2024-05-19: 20 mg via ORAL
  Filled 2024-05-19: qty 2

## 2024-05-19 MED ORDER — OXYCODONE HCL 5 MG PO TABS
5.0000 mg | ORAL_TABLET | ORAL | 0 refills | Status: DC | PRN
  Filled 2024-05-19: qty 45, 4d supply, fill #0

## 2024-05-19 MED ORDER — ACETAMINOPHEN 500 MG PO TABS: 1000.0000 mg | ORAL_TABLET | Freq: Four times a day (QID) | ORAL | Status: DC

## 2024-05-19 NOTE — Discharge Summary (Signed)
 Physician Discharge Summary  Patient ID: Gary Stone MRN: [id number] DOB/AGE: [birth year] 48 y.o.  Admit date: 05/18/2024 Discharge date: 05/19/2024  Admission Diagnoses:  C5-C7 cervical spondylosis  Discharge Diagnoses:  Principal Problem:   Cervical disc disorder with radiculopathy of cervical region Active Problems:   Muscle weakness of right upper extremity   Decreased sensation of hand and upper extremity   Cervical radiculopathy   Discharged Condition: good  Hospital Course:  Gary Stone is a 48 y.o presenting with cervical radiculopathy and right arm weakness s/p C5-7 arthroplasty. His interoperative course was uncomplicated. He was admitted overnight for pain control and therapy evaluation. He was seen and evaluated by therapy and deemed appropriate for discharge home on postop day 1 with consideration for outpatient OT should he have persistent hand weakness.  He reported improvement of his preop right arm pain with expected neck pain which was controlled with oral medications.  He was discharged home with instructions to continue his home MS Contin and take prescribed oxycodone for breakthrough pain.  He was given an increased dose of baclofen  to 20 mg 3 times daily (home dose of 20mg  BID), Senna and Miralax to take as needed.   Consults: None  Significant Diagnostic Studies: NA  Treatments: surgery: as above. Please see separately dictated operative report for further details   Discharge Exam: Blood pressure (!) 157/84, pulse 99, temperature 98.2 F (36.8 C), temperature source Oral, resp. rate 17, height 5' 11 (1.803 m), weight 106 kg, SpO2 99%. AA Ox3 CNI   Strength: Side Biceps Triceps Deltoid Interossei Grip Wrist Ext. Wrist Flex.  R 4+ 4+ 5 5 5  4+ 4+  L 5 5 5 5 5 5 5     Incision: c/d/I with dermabond in place  Disposition: Discharge disposition: 01-Home or Self Care       Discharge Instructions     Incentive spirometry RT   Complete by: As  directed    No wound care   Complete by: As directed       Allergies as of 05/19/2024   No Known Allergies      Medication List     PAUSE taking these medications    HYDROcodone-acetaminophen 10-325 MG tablet Wait to take this until your doctor or other care provider tells you to start again. Commonly known as: NORCO Take 1 tablet by mouth every 8 (eight) hours as needed.       TAKE these medications    acetaminophen 500 MG tablet Commonly known as: TYLENOL Take 2 tablets (1,000 mg total) by mouth every 6 (six) hours.   ALPRAZolam  1 MG tablet Commonly known as: XANAX  Take 1 tablet (1 mg total) by mouth 2 (two) times daily as needed for anxiety.   amphetamine-dextroamphetamine 10 MG 24 hr capsule Commonly known as: ADDERALL XR Take 30 mg by mouth 2 (two) times daily.   baclofen  20 MG tablet Commonly known as: LIORESAL  Take 1 tablet (20 mg total) by mouth 3 (three) times daily. What changed:  when to take this reasons to take this   blood glucose meter kit and supplies Kit Dispense based on patient and insurance preference. Use up to four times daily as directed. (FOR ICD-9 250.00, 250.01).   Dexcom G6 Sensor Misc Use to check Blood glucose up to 4 times daily   Dexcom G6 Transmitter Misc Use w/ Dexcom sensors   diclofenac  75 MG EC tablet Commonly known as: VOLTAREN  Take 1 tablet (75 mg total) by mouth 2 (two) times daily  as needed for moderate pain (pain score 4-6).   gabapentin 800 MG tablet Commonly known as: NEURONTIN Take 800 mg by mouth 3 (three) times daily.   Insulin  Degludec FlexTouch 100 UNIT/ML Sopn Inject 30 Units into the skin 2 (two) times daily.   Insulin  Pen Needle 32G X 6 MM Misc UAD with insulin  E10.69   Insulin  Syringe-Needle U-100 30G X 1/2 0.5 ML Misc Commonly known as: B-D INS SYRINGE 0.5CC/30GX1/2 1 each by Does not apply route 2 (two) times daily.   morphine 15 MG 12 hr tablet Commonly known as: MS CONTIN Take 15 mg by  mouth every 12 (twelve) hours.   Narcan 4 MG/0.1ML Liqd nasal spray kit Generic drug: naloxone 1 (ONE) SPRAY AS NEEDED   NovoLOG  FlexPen 100 UNIT/ML FlexPen Generic drug: insulin  aspart Inject 5-20 Units into the skin 3 (three) times daily with meals.   OneTouch Ultra Test test strip Generic drug: glucose blood TEST BLOOD SUGAR 5 TIMES DAILY DX E10.9   oxyCODONE 5 MG immediate release tablet Commonly known as: Oxy IR/ROXICODONE Take 1-2 tablets (5-10 mg total) by mouth every 4 (four) hours as needed for moderate pain (pain score 4-6).   polyethylene glycol 17 g packet Commonly known as: MIRALAX / GLYCOLAX Take 17 g by mouth daily as needed for moderate constipation.   pravastatin  40 MG tablet Commonly known as: PRAVACHOL  Take 1 tablet (40 mg total) by mouth daily.   senna 8.6 MG Tabs tablet Commonly known as: SENOKOT Take 1 tablet (8.6 mg total) by mouth 2 (two) times daily as needed for mild constipation.         Signed: Edsel Jama Goods 05/19/2024, 10:43 AM

## 2024-05-19 NOTE — Progress Notes (Addendum)
   Neurosurgery Progress Note  History: Gary Stone is s/p C5-7 arthroplasty  POD1: Pt experiencing expected neck pain. He reports significant improvement in his right arm pain.    Physical Exam: Vitals:   05/18/24 2306 05/19/24 0428  BP: (!) 154/96 138/80  Pulse: 100 99  Resp: 18 18  Temp: 97.7 F (36.5 C) 98.2 F (36.8 C)  SpO2: 96% 95%    AA Ox3 CNI  Strength: Side Biceps Triceps Deltoid Interossei Grip Wrist Ext. Wrist Flex.  R 4+ 4+ 5 5 5  4+ 4+  L 5 5 5 5 5 5 5    Incision: c/d/I with dermabond in place  Data:  Other tests/results: NA  Assessment/Plan:  Gary Stone is a 48 y.o presenting with right sided cervical radiculopathy s/p C5-7 arthroplasty   - mobilize - pain control; will increase frequency of baclofen  - DVT prophylaxis - PTOT; dispo planning underway  Edsel Goods PA-C Department of Neurosurgery

## 2024-05-19 NOTE — Evaluation (Signed)
 Occupational Therapy Evaluation Patient Details Name: Gary Stone MRN: [id number] DOB: Dec 04, 1975 Today's Date: 05/19/2024   History of Present Illness   Pt is a 48 year old male s/p C5-7 arthroplasty 05/18/24     Clinical Impressions Pt is greeted semi supine in bed, agreeable to OT evaluation. PTA pt is indep in ADL/IADL, works as a Scientist, product/process development. Pt educated in cervical precautions, self care skills, AE, and home/routines modifications to maximize safety and functional independence while minimizing falls risk and maintaining precautions. Pt verbalized understanding of all education/training provided. Able to return demonstration safe techniques while maintaining cervical precautions throughout. Handout provided to support recall and carry over of learned precautions/techniques for bed mobility, functional transfers, and self care skills. Pt is performing ADL/functional mobility at a supervision-MOD I level. Red therapy sponge provided for R hand grip strengthening. Pt is left in chair, all needs met. OT will follow acutely.     If plan is discharge home, recommend the following:   Assistance with cooking/housework;Assist for transportation     Functional Status Assessment   Patient has had a recent decline in their functional status and demonstrates the ability to make significant improvements in function in a reasonable and predictable amount of time.     Equipment Recommendations   None recommended by OT     Recommendations for Other Services         Precautions/Restrictions   Precautions Precautions: Fall Recall of Precautions/Restrictions: Intact Restrictions Weight Bearing Restrictions Per Provider Order: No     Mobility Bed Mobility Overal bed mobility: Needs Assistance Bed Mobility: Sidelying to Sit, Rolling Rolling: Supervision Sidelying to sit: Supervision       General bed mobility comments: intermittent vcs for technique     Transfers Overall transfer level: Needs assistance Equipment used: None Transfers: Sit to/from Stand Sit to Stand: Modified independent (Device/Increase time), Supervision                  Balance Overall balance assessment: Needs assistance Sitting-balance support: Feet supported Sitting balance-Leahy Scale: Good     Standing balance support: No upper extremity supported Standing balance-Leahy Scale: Good                             ADL either performed or assessed with clinical judgement   ADL Overall ADL's : Needs assistance/impaired Eating/Feeding: Modified independent   Grooming: Total assistance           Upper Body Dressing : Modified independent   Lower Body Dressing: Supervision/safety;Sitting/lateral leans Lower Body Dressing Details (indicate cue type and reason): using figure 4 Toilet Transfer: Supervision/safety;Ambulation;Regular Toilet           Functional mobility during ADLs: Supervision/safety (approx 100' no AD)       Vision Patient Visual Report: No change from baseline       Perception         Praxis         Pertinent Vitals/Pain Pain Assessment Pain Assessment: 0-10 Pain Score: 8  Pain Location: neck Pain Descriptors / Indicators: Discomfort Pain Intervention(s): Monitored during session, RN gave pain meds during session, Repositioned     Extremity/Trunk Assessment Upper Extremity Assessment Upper Extremity Assessment: Right hand dominant;RUE deficits/detail RUE Deficits / Details: mild RUE weakness- noted in R hand grip strength weakness, elbow extension/flexion 4+/5 RUE Sensation: WNL RUE Coordination: WNL   Lower Extremity Assessment Lower Extremity Assessment: Defer to PT evaluation  Communication Communication Communication: No apparent difficulties   Cognition Arousal: Alert Behavior During Therapy: WFL for tasks assessed/performed Cognition: No apparent impairments                                Following commands: Intact       Cueing  General Comments   Cueing Techniques: Verbal cues  vss throughout   Exercises     Shoulder Instructions      Home Living Family/patient expects to be discharged to:: Private residence Living Arrangements: Alone Available Help at Discharge: Family (parents 2 doors down) Type of Home: House Home Access: Stairs to enter Secretary/administrator of Steps: 3 Entrance Stairs-Rails: Can reach both Home Layout: One level     Bathroom Shower/Tub: Producer, television/film/video: Standard     Home Equipment: None          Prior Functioning/Environment Prior Level of Function : Independent/Modified Independent               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, theatre/television/film    OT Problem List: Decreased activity tolerance;Impaired UE functional use;Decreased strength   OT Treatment/Interventions: Self-care/ADL training;Balance training;Therapeutic exercise;Therapeutic activities;DME and/or AE instruction;Patient/family education      OT Goals(Current goals can be found in the care plan section)   Acute Rehab OT Goals Patient Stated Goal: go home OT Goal Formulation: With patient Time For Goal Achievement: 06/02/24 Potential to Achieve Goals: Good ADL Goals Pt Will Perform Lower Body Dressing: with modified independence;sitting/lateral leans Pt Will Transfer to Toilet: with modified independence;ambulating Pt/caregiver will Perform Home Exercise Program: Increased strength (R hand)   OT Frequency:  Min 3X/week    Co-evaluation              AM-PAC OT 6 Clicks Daily Activity     Outcome Measure Help from another person eating meals?: None Help from another person taking care of personal grooming?: None Help from another person toileting, which includes using toliet, bedpan, or urinal?: None Help from another person bathing (including washing, rinsing, drying)?: A Little Help from another  person to put on and taking off regular upper body clothing?: None Help from another person to put on and taking off regular lower body clothing?: None 6 Click Score: 23   End of Session Nurse Communication: Mobility status  Activity Tolerance: Patient tolerated treatment well Patient left: in chair;with call bell/phone within reach  OT Visit Diagnosis: Other abnormalities of gait and mobility (R26.89)                Time: 9140-9083 OT Time Calculation (min): 17 min Charges:  OT General Charges $OT Visit: 1 Visit OT Evaluation $OT Eval Low Complexity: 1 Low  Therisa Sheffield, OTD OTR/L  05/19/24, 10:41 AM

## 2024-05-19 NOTE — Progress Notes (Signed)
 DISCHARGE NOTE:  Pt was discharged with IV removed and dc instructions given. Pt received mediations delivered to hospital room. Pt voices no questions or concerns at this time. Pt wheeled down to medical mall entrance by staff. Pt's family friend provided transportation.

## 2024-05-19 NOTE — Plan of Care (Signed)
   Problem: Clinical Measurements: Goal: Ability to maintain clinical measurements within normal limits will improve Outcome: Progressing

## 2024-05-19 NOTE — Evaluation (Signed)
 Physical Therapy Evaluation Patient Details Name: Gary Stone MRN: [id number] DOB: [birth year] Today's Date: 05/19/2024  History of Present Illness  Pt is a 48 year old male s/p C5-7 arthroplasty 05/18/24  Clinical Impression  Pt pleasant and eager to work with PT, overall did very well and did not need physical assist with any aspect of mobility, transfers, gait, stairs, etc.  He showed appropriate awareness of neck posture/positioning/precautions and was able to perform all functional mobility w/o assistive device and no safety issues.  He is having expected surgical pain but reports that R UE radiating symptoms have resolved.  Pt will benefit from continued PT per MD recs and cervical protocols.       If plan is discharge home, recommend the following:     Can travel by private vehicle        Equipment Recommendations None recommended by PT  Recommendations for Other Services       Functional Status Assessment       Precautions / Restrictions Precautions Precautions: Fall Restrictions Weight Bearing Restrictions Per Provider Order: No      Mobility  Bed Mobility Overal bed mobility: Modified Independent Bed Mobility: Supine to Sit, Sit to Supine Rolling: Supervision, Modified independent (Device/Increase time) Sidelying to sit: Supervision, Modified independent (Device/Increase time)       General bed mobility comments: able to transition to/from sitting/supine w/o direct assist with no issues/hesitancy    Transfers Overall transfer level: Needs assistance Equipment used: None Transfers: Sit to/from Stand Sit to Stand: Modified independent (Device/Increase time), Supervision           General transfer comment: easily able to transition to standing w/o hesitation, assist or device    Ambulation/Gait Ambulation/Gait assistance: Modified independent (Device/Increase time) Gait Distance (Feet): 200 Feet Assistive device: None         General Gait  Details: Pt was able to ambulate with community appropriate speed and cadence, good confidence w/o fatigue or other issues.  Stairs Stairs: Yes Stairs assistance: Supervision Stair Management: No rails Number of Stairs: 4 General stair comments: Pt able to negotiate up/down steps reciprocally w/o UEs, chronic R ankle injury led to mild hesitancy w/o safety concern  Wheelchair Mobility     Tilt Bed    Modified Rankin (Stroke Patients Only)       Balance Overall balance assessment: Needs assistance Sitting-balance support: Feet supported Sitting balance-Leahy Scale: Good     Standing balance support: No upper extremity supported Standing balance-Leahy Scale: Good Standing balance comment: Pt was able to ambulate with good confidence and cadence, no LOBs or fatigue.                             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, theatre/television/film     Extremity/Trunk Assessment   Upper Extremity Assessment Upper Extremity Assessment: Right hand dominant RUE Deficits /  Details: mild RUE weakness- noted in R hand grip strength weakness, elbow extension/flexion 4+/5 RUE Sensation: WNL RUE Coordination: WNL    Lower Extremity Assessment Lower Extremity Assessment: Overall WFL for tasks assessed       Communication   Communication Communication: No apparent difficulties    Cognition Arousal:  Alert Behavior During Therapy: WFL for tasks assessed/performed                             Following commands: Intact       Cueing Cueing Techniques: Verbal cues     General Comments General comments (skin integrity, edema, etc.): Pt feeling well apart from surgical pain, states all numbness has resolved    Exercises     Assessment/Plan    PT Assessment Patient needs continued PT services  PT Problem List Decreased safety awareness;Decreased coordination;Decreased knowledge of precautions;Pain       PT Treatment Interventions Therapeutic activities;Therapeutic exercise;Neuromuscular re-education;Patient/family education    PT Goals (Current goals can be found in the Care Plan section)  Acute Rehab PT Goals Patient Stated Goal: go home today PT Goal Formulation: With patient Time For Goal Achievement: 05/26/24 Potential to Achieve Goals: Good    Frequency 7X/week     Co-evaluation               AM-PAC PT 6 Clicks Mobility  Outcome Measure Help needed turning from your back to your side while in a flat bed without using bedrails?: None Help needed moving from lying on your back to sitting on the side of a flat bed without using bedrails?: None Help needed moving to and from a bed to a chair (including a wheelchair)?: None Help needed standing up from a chair using your arms (e.g., wheelchair or bedside chair)?: None Help needed to walk in hospital room?: None Help needed climbing 3-5 steps with a railing? : None 6 Click Score: 24    End of Session Equipment Utilized During Treatment: Gait belt Activity Tolerance: Patient tolerated treatment well Patient left: in bed;with call bell/phone within reach Nurse Communication: Mobility status PT Visit Diagnosis: Muscle weakness (generalized) (M62.81);Difficulty in walking, not elsewhere classified (R26.2)    Time: 9071-9061 PT Time Calculation (min) (ACUTE ONLY): 10 min   Charges:   PT  Evaluation $PT Eval Low Complexity: 1 Low   PT General Charges $$ ACUTE PT VISIT: 1 Visit         Carmin JONELLE Deed, DPT 05/19/2024, 11:31 AM

## 2024-05-20 ENCOUNTER — Telehealth: Payer: Self-pay | Admitting: Family Medicine

## 2024-05-20 NOTE — Telephone Encounter (Signed)
 Copied from CRM [id number]. Topic: Medical Record Request - Payor/Billing Request >> May 20, 2024 11:19 AM Tobias CROME wrote: Reason for CRM: Alan with [employer] [facility] calling to confirm a past surgery date and postop visit for patient. Requesting to confirm 05/18/2024.   Requesting: [id number]

## 2024-05-20 NOTE — Telephone Encounter (Signed)
 Completed. Dates given. LS

## 2024-05-31 ENCOUNTER — Ambulatory Visit: Admitting: Family Medicine

## 2024-05-31 NOTE — Op Note (Addendum)
 Indications: [name] is a 48 y.o. male with history of severe progressive cervical spondylosis with severe cervical radiculopathy and weakness.  Patient attempted conservative care with observation but continued to have significant weakness numbness and tingling leading him to go forward with a cervical decompression.  Findings: Large disc herniations noted both at C5-6 and C6-7  Preoperative Diagnosis: C5-C7 cervical spondylosis causing severe radiculopathy  Postoperative Diagnosis: C5-C7 cervical spondylosis causing severe radiculopathy   EBL: 10 mL ml IVF: Minimal Drains: none Disposition: Extubated and Stable to PACU Complications: none  No foley catheter was placed.   Preoperative Note:   Risks of surgery discussed include: infection, bleeding, stroke, coma, death, paralysis, CSF leak, nerve/spinal cord injury, numbness, tingling, weakness, complex regional pain syndrome, recurrent stenosis and/or disc herniation, vascular injury, development of instability, neck/back pain, need for further surgery, persistent symptoms, development of deformity, and the risks of anesthesia. The patient understood these risks and agreed to proceed.  Operative Note:  Procedure:  1) Cervical Disc Arthroplasty at C5/7 using a LDR Mobi-C device   Procedure: After obtaining informed consent, the patient taken to the operating room, placed in supine position, general anesthesia induced.  The patient had a small shoulder roll placed behind the neck.  The patient received preop antibiotics and IV Decadron.  The patient had a neck incision outlined, was prepped and draped in usual sterile fashion. The incision was injected with local anesthetic.   An incision was opened, dissection taken down medial to the carotid artery and jugular vein, lateral to the trachea and esophagus.  The prevertebral fascia identified and a localizing x-ray demonstrated the correct level.  The longus colli were dissected  laterally, and self-retaining retractors placed to open the operative field. The microscope was then brought into the field.  With this complete, distractor pins were placed in the vertebral bodies of C5 and C6. The distractor was placed, and the annulus at C5/6 was opened using a bovie.  Curettes and pituitary rongeurs used to remove the majority of disk, then the drill was used to remove the posterior osteophyte and begin the foraminotomies. The nerve hook was used to elevate the posterior longitudinal ligament, which was then removed with Kerrison rongeurs. The microblunt nerve hook could be passed out the foramen bilaterally.   Meticulous hemostasis was obtained.  A trial spacer was used to size the disc space. Using flouroscopic guidance, a 15 mm width x 17 mm depth x 4 mm height Mobi-C was then inserted in the prepared disc space.  With this complete, distractor pins were placed in the vertebral bodies of C6 and C7. The distractor was placed, and the annulus at C6/7 was opened using a bovie.  Curettes and pituitary rongeurs used to remove the majority of disk, then the drill was used to remove the posterior osteophyte and begin the foraminotomies. The nerve hook was used to elevate the posterior longitudinal ligament, which was then removed with Kerrison rongeurs. The microblunt nerve hook could be passed out the foramen bilaterally.   Meticulous hemostasis was obtained.  A trial spacer was used to size the disc space. Using flouroscopic guidance, a 15 mm width x 17 mm depth x 4 mm height Mobi-C was then inserted in the prepared disc space.  The caspar distractor was removed, and bone wax used for hemostasis. Final AP and lateral radiographs were taken.   With the disc arthroplasty devices in good position, the wound was irrigated copiously and meticulous hemostasis obtained.  Wound was  closed in 2 layers using interrupted inverted 3-0 Vicryl sutures.  The wound was dressed with dermabond, the head of  bed at 30 degrees, taken to recovery room in stable condition.  No new postop neurological deficits were identified.  Sponge and pattie counts were correct at the end of the procedure.     I performed the entire procedure with the assistance of Gary Stone, [facility] as an designer, television/film set. An assistant was required for this procedure due to the complexity.  The assistant provided assistance in tissue manipulation and suction, and was required for the successful and safe performance of the procedure. I performed the critical portions of the procedure.   Gary MICAEL Sharps, MD  Implant Name Type Inv. Item Serial No. Manufacturer Lot No. LRB No. Used Action  DISC MOBI-C CERV STD 15X17 H4. - [id number] Miscellaneous DISC MOBI-C CERV STD 15X17 H4.  [facility] [employer] [id number] N/A 1 Implanted  DISC MOBI-C CERV STD 15X17 H4. - [id number] Miscellaneous DISC MOBI-C CERV STD 15X17 H4.  [facility] [employer] [id number] N/A 1 Implanted

## 2024-06-01 ENCOUNTER — Encounter: Payer: Self-pay | Admitting: Physician Assistant

## 2024-06-01 ENCOUNTER — Ambulatory Visit (INDEPENDENT_AMBULATORY_CARE_PROVIDER_SITE_OTHER): Admitting: Physician Assistant

## 2024-06-01 VITALS — BP 132/88 | Temp 98.1°F | Ht 71.0 in | Wt 247.4 lb

## 2024-06-01 DIAGNOSIS — M5010 Cervical disc disorder with radiculopathy, unspecified cervical region: Secondary | ICD-10-CM

## 2024-06-01 DIAGNOSIS — Z09 Encounter for follow-up examination after completed treatment for conditions other than malignant neoplasm: Secondary | ICD-10-CM

## 2024-06-01 DIAGNOSIS — M50222 Other cervical disc displacement at C5-C6 level: Secondary | ICD-10-CM

## 2024-06-01 DIAGNOSIS — M50223 Other cervical disc displacement at C6-C7 level: Secondary | ICD-10-CM

## 2024-06-01 NOTE — Progress Notes (Signed)
   REFERRING PHYSICIAN:  Jolinda Norene CHRISTELLA Rosalea [address] [place],  [place] [address]  DOS: 05/18/24, C5-7 arthroplasty  HISTORY OF PRESENT ILLNESS: Gary Stone is 2 weeks status post C5-7 arthroplasty. Overall, he is doing very well.  He feels as though he has improved.  He does have some expected stiffness especially between his shoulder blades, but otherwise he has no more shooting pain down his arm and is very happy.  He is on his baseline pain medicine to include hydrocodone and morphine and is using baclofen  as needed.     PHYSICAL EXAMINATION:  NEUROLOGICAL:  General: In no acute distress.   Awake, alert, oriented to person, place, and time.  Pupils equal round and reactive to light.  Facial tone is symmetric.    Strength: Side Biceps Triceps Deltoid Interossei Grip Wrist Ext. Wrist Flex.  R 5 5 5 5 5 5 5   L 5 5 5 5 5 5 5    Incision c/d/I  Imaging:  No new interval imaging  Assessment / Plan: Gary Stone is doing well after C5-7 arthroplasty. We discussed activity escalation and I have advised the patient to lift up to 10 pounds until 6 weeks after surgery, then increase up to 25 pounds until 12 weeks after surgery.  After 12 weeks post-op, the patient advised to increase activity as tolerated. he will return to clinic in approximately 1 month for his 6-week postop visit.    Advised to contact the office if any questions or concerns arise.   Lyle Decamp PA-C Dept of Neurosurgery

## 2024-06-10 ENCOUNTER — Other Ambulatory Visit: Payer: Self-pay | Admitting: *Deleted

## 2024-06-10 DIAGNOSIS — E1069 Type 1 diabetes mellitus with other specified complication: Secondary | ICD-10-CM

## 2024-06-17 ENCOUNTER — Other Ambulatory Visit: Payer: Self-pay | Admitting: Physician Assistant

## 2024-06-17 MED ORDER — OXYCODONE HCL 5 MG PO TABS: 5.0000 mg | ORAL_TABLET | Freq: Four times a day (QID) | ORAL | 0 refills | Status: AC | PRN

## 2024-06-17 NOTE — Telephone Encounter (Signed)
 [facility] called to confirm that we sent in oxycodone  and that we are aware that he is taking norco and morphine . I informed them that the patient told us  yesterday he is taking both of those and that we sent in a refill of oxycodone  to take for breakthrough pain because he had surgery last month.

## 2024-06-17 NOTE — Telephone Encounter (Signed)
 Patient asking for a refill of oxycodone   Current Regimen: Tylenol - not taking, states it does not help at all Baclofen  - 2 tablets 3 times a day Voltaren  - 75mg  2 times a day Gabapentin - 800mg  1 time a day Norco - 1 tablet every 8 hours Morphine - 15mg  2 times a day Oxy - has been out for about 2 weeks

## 2024-06-18 ENCOUNTER — Other Ambulatory Visit: Payer: Self-pay | Admitting: Family Medicine

## 2024-06-18 DIAGNOSIS — M5010 Cervical disc disorder with radiculopathy, unspecified cervical region: Secondary | ICD-10-CM

## 2024-06-28 ENCOUNTER — Encounter: Payer: Self-pay | Admitting: Neurosurgery

## 2024-06-28 ENCOUNTER — Other Ambulatory Visit: Payer: Self-pay | Admitting: Family Medicine

## 2024-06-28 ENCOUNTER — Ambulatory Visit: Admitting: Neurosurgery

## 2024-06-28 ENCOUNTER — Ambulatory Visit

## 2024-06-28 VITALS — BP 132/80 | Temp 98.9°F | Ht 71.0 in | Wt 247.0 lb

## 2024-06-28 DIAGNOSIS — M5010 Cervical disc disorder with radiculopathy, unspecified cervical region: Secondary | ICD-10-CM

## 2024-06-28 DIAGNOSIS — F411 Generalized anxiety disorder: Secondary | ICD-10-CM

## 2024-06-28 DIAGNOSIS — Z09 Encounter for follow-up examination after completed treatment for conditions other than malignant neoplasm: Secondary | ICD-10-CM

## 2024-06-28 DIAGNOSIS — R208 Other disturbances of skin sensation: Secondary | ICD-10-CM

## 2024-06-28 DIAGNOSIS — M6281 Muscle weakness (generalized): Secondary | ICD-10-CM

## 2024-06-28 NOTE — Progress Notes (Signed)
   REFERRING PHYSICIAN:  Jolinda Norene CHRISTELLA Rosalea [address] [place],  [place] [address]  DOS: 05/18/24, C5-7 arthroplasty  Discussed the use of AI scribe software for clinical note transcription with the patient, who gave verbal consent to proceed.  History of Present Illness Gary Stone is a 48 year old male who presents for a follow-up visit after cervical spine surgery. He is six weeks post-operative and has persistent stiffness in the upper neck, described as a tight, knotted sensation localized to the upper neck. His arm function has significantly improved with resolution of prior nerve pain and weakness. He is satisfied with his strength and pain relief. He notices soreness in the neck after lifting his 40-pound child, which he attributes to muscle soreness rather than recurrent radicular symptoms. Before surgery he had marked weakness from a disc herniation.   PHYSICAL EXAMINATION:  NEUROLOGICAL:  General: In no acute distress.   Awake, alert, oriented to person, place, and time.  Pupils equal round and reactive to light.  Facial tone is symmetric.    Strength: Side Biceps Triceps Deltoid Interossei Grip Wrist Ext. Wrist Flex.  R 5 5 5 5 5 5 5   L 5 5 5 5 5 5 5    Incision c/d/I  Imaging:  Imaging with hardware in expected location  Assessment and Plan Assessment & Plan Postoperative management of C5-C7 cervical spondylosis with severe radiculopathy Six weeks post-surgery for C5-C7 cervical spondylosis with severe radiculopathy. Reports improvement in nerve pain and weakness, with no current issues in arm function. Experiencing expected postoperative stiffness and soreness, particularly in the neck and trapezius muscles. Swallowing difficulties and sore throat are typical postoperative symptoms. Strength has improved significantly, allowing for lifting of his child, though some soreness persists. X-rays appear satisfactory, awaiting official reads. Recovery is  progressing well, with further improvement anticipated by the next appointment. - Continue current activity level with caution regarding lifting restrictions. - Will reassess lifting restrictions at the next appointment. - Will schedule follow-up appointment in two and a half to three months.  [name] MD Dept of Neurosurgery

## 2024-06-29 ENCOUNTER — Other Ambulatory Visit: Payer: Self-pay | Admitting: Family Medicine

## 2024-06-29 DIAGNOSIS — F411 Generalized anxiety disorder: Secondary | ICD-10-CM

## 2024-07-06 ENCOUNTER — Ambulatory Visit: Payer: Self-pay | Admitting: Neurosurgery

## 2024-07-06 ENCOUNTER — Ambulatory Visit: Payer: Self-pay

## 2024-07-06 DIAGNOSIS — F411 Generalized anxiety disorder: Secondary | ICD-10-CM

## 2024-07-06 NOTE — Telephone Encounter (Signed)
 FYI Only or Action Required?: Action required by provider: medication refill request and clinical question for provider.  Patient was last seen in primary care on 04/13/2024 by Jolinda Norene HERO, DO.  Called Nurse Triage reporting Anxiety and Medication Refill.  Symptoms began yesterday.  Interventions attempted: Prescription medications: alprazolam .  Symptoms are: unchanged.  Triage Disposition: See PCP When Office is Open (Within 3 Days)  Patient/caregiver understands and will follow disposition?: Yes   Copied from CRM #[id number]. Topic: Clinical - Red Word Triage >> Jul 06, 2024 11:32 AM Victoria B wrote: Patient has anxiety Reason for Disposition  MODERATE anxiety (e.g., persistent or frequent anxiety symptoms; interferes with sleep, school, or work)  Answer Assessment - Initial Assessment Questions Offered appt with alt prov today, patient declines, only wants pcp, scheduled 07/07/24. Ran out of Alprazolam  yesterday, reports do not have any meds today and have been taking for years. LOV 04/13/24  Advised call back if symptoms worsen. 1. CONCERN: Did anything happen that prompted you to call today?      Anxiety, need med refill 2. ANXIETY SYMPTOMS: Can you describe how you (your loved one; patient) have been feeling? (e.g., tense, restless, panicky, anxious, keyed up, overwhelmed, sense of impending doom).      Neck surgery, a lot has been going on 3. ONSET: How long have you been feeling this way? (e.g., hours, days, weeks)     yesterday 4. SEVERITY: How would you rate the level of anxiety? (e.g., 0 - 10; or mild, moderate, severe).     8/10; last took medication yesterday; alprazolam  5. FUNCTIONAL IMPAIRMENT: How have these feelings affected your ability to do daily activities? Have you had more difficulty than usual doing your normal daily activities? (e.g., getting better, same, worse; self-care, school, work, interactions)     Yes, if not taken 6. HISTORY:  Have you felt this way before? Have you ever been diagnosed with an anxiety problem in the past? (e.g., generalized anxiety disorder, panic attacks, PTSD). If Yes, ask: How was this problem treated? (e.g., medicines, counseling, etc.)     anxiety 7. RISK OF HARM - SUICIDAL IDEATION: Do you ever have thoughts of hurting or killing yourself? If Yes, ask:  Do you have these feelings now? Do you have a plan on how you would do this?     no  11. PATIENT SUPPORT: Who is with you now? Who do you live with? Do you have family or friends who you can talk to?        yes 12. OTHER SYMPTOMS: Do you have any other symptoms? (e.g., feeling depressed, trouble concentrating, trouble sleeping, trouble breathing, palpitations or fast heartbeat, chest pain, sweating, nausea, or diarrhea)      denies  Protocols used: Anxiety and Panic Attack-A-AH

## 2024-07-06 NOTE — Telephone Encounter (Signed)
Noted  -LS

## 2024-07-07 ENCOUNTER — Encounter: Payer: Self-pay | Admitting: Family Medicine

## 2024-07-07 ENCOUNTER — Other Ambulatory Visit: Payer: Self-pay | Admitting: Family Medicine

## 2024-07-07 ENCOUNTER — Ambulatory Visit: Admitting: Family Medicine

## 2024-07-07 VITALS — BP 125/73 | HR 100 | Temp 98.7°F | Ht 71.0 in | Wt 249.8 lb

## 2024-07-07 DIAGNOSIS — E1069 Type 1 diabetes mellitus with other specified complication: Secondary | ICD-10-CM

## 2024-07-07 DIAGNOSIS — F411 Generalized anxiety disorder: Secondary | ICD-10-CM

## 2024-07-07 DIAGNOSIS — Z79899 Other long term (current) drug therapy: Secondary | ICD-10-CM

## 2024-07-07 MED ORDER — INSULIN DEGLUDEC FLEXTOUCH 100 UNIT/ML ~~LOC~~ SOPN: 30.0000 [IU] | PEN_INJECTOR | Freq: Two times a day (BID) | SUBCUTANEOUS | 99 refills | Status: AC

## 2024-07-07 MED ORDER — DEXCOM G7 RECEIVER DEVI: 0 refills | Status: AC

## 2024-07-07 MED ORDER — ALPRAZOLAM 1 MG PO TABS: 1.0000 mg | ORAL_TABLET | Freq: Two times a day (BID) | ORAL | 5 refills | Status: AC | PRN

## 2024-07-07 MED ORDER — DEXCOM G7 SENSOR MISC: 4 refills | Status: AC

## 2024-07-07 NOTE — Progress Notes (Signed)
 Subjective: CC:GAD PCP: Jolinda Norene HERO, DO YEP:Gary Stone is a 48 y.o. male presenting to clinic today for:  GAD Reports short lapse in his alprazolam  due to having missed his last appointment in November after having C-spine surgery.  He reports that he is healing well from the C-spine surgery and that the radicular symptoms have totally resolved.  Still having a little bit of postop discomfort but nothing that is requiring the as needed oxycodone  fives that was prescribed him.  He has been able to rely on his normal pain medications for his pain management center  DM1 with HLD Compliant with medications.  Not utilizing his Dexcom regularly due to cost.  Needs refills on insulin  as well.  No hypoglycemia reported.   Lab Results  Component Value Date   HGBA1C 7.5 (H) 05/11/2024      ROS: Per HPI  No Known Allergies Past Medical History:  Diagnosis Date   Anxiety    Complication of anesthesia    Diabetes mellitus without complication (HCC)    Metal foreign body in scalp    seen on skull xray 04/18/2024    Current Outpatient Medications:    ALPRAZolam  (XANAX ) 1 MG tablet, Take 1 tablet (1 mg total) by mouth 2 (two) times daily as needed for anxiety., Disp: 60 tablet, Rfl: 5   amphetamine-dextroamphetamine (ADDERALL) 30 MG tablet, Take 1 tablet by mouth 2 (two) times daily., Disp: , Rfl:    baclofen  (LIORESAL ) 10 MG tablet, Take 2 tablets (20 mg total) by mouth 3 (three) times daily., Disp: 60 tablet, Rfl: 0   blood glucose meter kit and supplies KIT, Dispense based on patient and insurance preference. Use up to four times daily as directed. (FOR ICD-9 250.00, 250.01)., Disp: 1 each, Rfl: 0   Continuous Glucose Sensor (DEXCOM G6 SENSOR) MISC, Use to check Blood glucose up to 4 times daily, Disp: 90 each, Rfl: 9   Continuous Glucose Transmitter (DEXCOM G6 TRANSMITTER) MISC, Use w/ Dexcom sensors, Disp: 1 each, Rfl: 0   diclofenac  (VOLTAREN ) 75 MG EC tablet, Take 1 tablet  (75 mg total) by mouth 2 (two) times daily as needed for moderate pain (pain score 4-6)., Disp: 60 tablet, Rfl: 3   gabapentin  (NEURONTIN ) 800 MG tablet, Take 800 mg by mouth 3 (three) times daily., Disp: , Rfl:    HYDROcodone-acetaminophen  (NORCO) 10-325 MG tablet, Take 1 tablet by mouth in the morning and at bedtime., Disp: , Rfl:    insulin  aspart (NOVOLOG  FLEXPEN) 100 UNIT/ML FlexPen, Inject 5-20 Units into the skin 3 (three) times daily with meals., Disp: 45 mL, Rfl: 11   Insulin  Degludec FlexTouch 100 UNIT/ML SOPN, Inject 30 Units into the skin 2 (two) times daily., Disp: 18 mL, Rfl: 0   Insulin  Pen Needle 32G X 6 MM MISC, UAD with insulin  E10.69, Disp: 100 each, Rfl: 3   Insulin  Syringe-Needle U-100 (B-D INS SYRINGE 0.5CC/30GX1/2) 30G X 1/2 0.5 ML MISC, 1 each by Does not apply route 2 (two) times daily., Disp: 100 each, Rfl: 2   morphine  (MS CONTIN ) 15 MG 12 hr tablet, Take 15 mg by mouth every 12 (twelve) hours., Disp: , Rfl:    NARCAN 4 MG/0.1ML LIQD nasal spray kit, 1 (ONE) SPRAY AS NEEDED, Disp: , Rfl: 3   ONETOUCH ULTRA TEST test strip, TEST BLOOD SUGAR 5 TIMES DAILY DX E10.9, Disp: 100 strip, Rfl: 19   oxyCODONE  (OXY IR/ROXICODONE ) 5 MG immediate release tablet, Take 1-2 tablets (5-10 mg total) by  mouth every 6 (six) hours as needed for moderate pain (pain score 4-6)., Disp: 45 tablet, Rfl: 0 Social History   Socioeconomic History   Marital status: Married    Spouse name: Not on file   Number of children: Not on file   Years of education: Not on file   Highest education level: Some college, no degree  Occupational History   Not on file  Tobacco Use   Smoking status: Every Day    Current packs/day: 0.25    Types: Cigarettes   Smokeless tobacco: Never  Vaping Use   Vaping status: Never Used  Substance and Sexual Activity   Alcohol use: No   Drug use: No   Sexual activity: Yes  Other Topics Concern   Not on file  Social History Narrative   Not on file   Social  Drivers of Health   Financial Resource Strain: Low Risk  (04/12/2024)   Overall Financial Resource Strain (CARDIA)    Difficulty of Paying Living Expenses: Not very hard  Food Insecurity: No Food Insecurity (05/18/2024)   Hunger Vital Sign    Worried About Running Out of Food in the Last Year: Never true    Ran Out of Food in the Last Year: Never true  Transportation Needs: No Transportation Needs (05/18/2024)   PRAPARE - Administrator, Civil Service (Medical): No    Lack of Transportation (Non-Medical): No  Physical Activity: Sufficiently Active (04/12/2024)   Exercise Vital Sign    Days of Exercise per Week: 5 days    Minutes of Exercise per Session: 150+ min  Stress: No Stress Concern Present (04/12/2024)   [employer] of Occupational Health - Occupational Stress Questionnaire    Feeling of Stress: Only a little  Social Connections: Unknown (04/12/2024)   Social Connection and Isolation Panel    Frequency of Communication with Friends and Family: Patient declined    Frequency of Social Gatherings with Friends and Family: Patient declined    Attends Religious Services: Patient declined    Database Administrator or Organizations: Patient declined    Attends Banker Meetings: Not on file    Marital Status: Patient declined  Intimate Partner Violence: Not At Risk (05/18/2024)   Humiliation, Afraid, Rape, and Kick questionnaire    Fear of Current or Ex-Partner: No    Emotionally Abused: No    Physically Abused: No    Sexually Abused: No   Family History  Problem Relation Age of Onset   Healthy Mother    Healthy Father     Objective: Office vital signs reviewed. BP 125/73   Pulse 100   Temp 98.7 F (37.1 C)   Ht 5' 11 (1.803 m)   Wt 249 lb 12.8 oz (113.3 kg)   SpO2 97%   BMI 34.84 kg/m   Physical Examination:  General: Awake, alert, well nourished, No acute distress HEENT: sclera white, MMM Cardio: regular rate and rhythm, S1S2 heard,  no murmurs appreciated Pulm: clear to auscultation bilaterally, no wheezes, rhonchi or rales; normal work of breathing on room air MSK: Ambulating independently with normal gait and station. Psych: Mood stable, speech normal, affect appropriate     07/07/2024    3:01 PM 12/24/2023    3:30 PM 06/18/2023    4:11 PM  Depression screen PHQ 2/9  Decreased Interest 0 0 0  Down, Depressed, Hopeless 0 0 0  PHQ - 2 Score 0 0 0  Altered sleeping  0 0  Tired, decreased energy  0 0  Change in appetite  0 0  Feeling bad or failure about yourself   0 0  Trouble concentrating  0 0  Moving slowly or fidgety/restless  0 0  Suicidal thoughts  0 0  PHQ-9 Score  0  0   Difficult doing work/chores  Not difficult at all      Data saved with a previous flowsheet row definition      07/07/2024    3:01 PM 12/24/2023    3:30 PM 06/18/2023    4:11 PM 06/18/2023    4:10 PM  GAD 7 : Generalized Anxiety Score  Nervous, Anxious, on Edge 1 0 0 0  Control/stop worrying 0 0 0 0  Worry too much - different things 0 0 0 0  Trouble relaxing 0 0 0 0  Restless 0 0 0 0  Easily annoyed or irritable 0 0 0 0  Afraid - awful might happen 0 0 0 0  Total GAD 7 Score 1 0 0 0  Anxiety Difficulty Not difficult at all Not difficult at all  Not difficult at all        Assessment/ Plan: 47 y.o. male   Generalized anxiety disorder - Plan: ToxASSURE Select 13 (MW), Urine, ALPRAZolam  (XANAX ) 1 MG tablet  Chronic use of benzodiazepine for therapeutic purpose - Plan: ToxASSURE Select 13 (MW), Urine  High risk medication use - Plan: ToxASSURE Select 13 (MW), Urine  Type 1 diabetes mellitus with other specified complication (HCC) - Plan: Microalbumin / creatinine urine ratio, Insulin  Degludec FlexTouch 100 UNIT/ML SOPN  Hyperlipidemia due to type 1 diabetes mellitus (HCC)   Anxiety is chronic and stable.  Spot UDS collected.  Up-to-date on CSA.  National narcotic database reviewed and there were no red flags.  Had  appropriate prescription for opioid from surgeon after C-spine surgery, from which he is healing well.  Urine micro collected.  Insulin  renewed.  I gave him samples of CGM and new Rx also sent to pharmacy.  He may follow-up at his normal interval first physical exam with fasting labs at which point we will recheck his cholesterol level   Kianah Harries CHRISTELLA Fielding, DO [facility] [phone]

## 2024-07-08 LAB — MICROALBUMIN / CREATININE URINE RATIO
Creatinine, Urine: 245.3 mg/dL
Microalb/Creat Ratio: 5 mg/g{creat} (ref 0–29)
Microalbumin, Urine: 12 ug/mL

## 2024-07-09 ENCOUNTER — Ambulatory Visit: Payer: Self-pay | Admitting: Family Medicine

## 2024-07-11 LAB — TOXASSURE SELECT 13 (MW), URINE

## 2024-08-09 ENCOUNTER — Other Ambulatory Visit: Payer: Self-pay | Admitting: Physician Assistant

## 2024-08-09 ENCOUNTER — Ambulatory Visit: Admitting: Physician Assistant

## 2024-08-09 ENCOUNTER — Ambulatory Visit

## 2024-08-09 ENCOUNTER — Encounter: Payer: Self-pay | Admitting: Physician Assistant

## 2024-08-09 VITALS — BP 136/88 | Temp 97.6°F | Ht 71.0 in | Wt 243.5 lb

## 2024-08-09 DIAGNOSIS — Z981 Arthrodesis status: Secondary | ICD-10-CM

## 2024-08-09 DIAGNOSIS — Z09 Encounter for follow-up examination after completed treatment for conditions other than malignant neoplasm: Secondary | ICD-10-CM

## 2024-08-09 DIAGNOSIS — M4722 Other spondylosis with radiculopathy, cervical region: Secondary | ICD-10-CM

## 2024-08-09 DIAGNOSIS — G959 Disease of spinal cord, unspecified: Secondary | ICD-10-CM | POA: Diagnosis not present

## 2024-08-09 DIAGNOSIS — M5010 Cervical disc disorder with radiculopathy, unspecified cervical region: Secondary | ICD-10-CM

## 2024-08-09 NOTE — Progress Notes (Signed)
" ° °  REFERRING PHYSICIAN:  Jolinda Norene CHRISTELLA Rosalea [address] [place],  [place] [address]  DOS: 05/18/24, C5-7 arthroplasty  HISTORY OF PRESENT ILLNESS: Langston [place] is 12 weeks status post C5-7 arthroplasty. Overall, he is doing very well.  He feels as though he has improved.  He does have some expected stiffness especially between his shoulder blades, more on his left side compared to the right.  Otherwise is doing well.    PHYSICAL EXAMINATION:  NEUROLOGICAL:  General: In no acute distress.   Awake, alert, oriented to person, place, and time.  Pupils equal round and reactive to light.  Facial tone is symmetric.    Strength: Side Biceps Triceps Deltoid Interossei Grip Wrist Ext. Wrist Flex.  R 5 5 5 5 5 5 5   L 5 5 5 5 5 5 5    Incision c/d/I  Imaging:  Imaging today is well-appearing.  Will await final radiology read, but no hardware failure is noted.  Assessment / Plan: Kele Barthelemy is doing well after C5-7 arthroplasty. We discussed activity escalation and I have instructed him that at this point there are no significant restrictions, but he is concerned about going back to work and having to lift significantly heavy items.  I stated we can keep him on a 50 pound weight restriction for the next 2 weeks once he returns to work and these restrictions can be lifted after that 2-week time interval.  He may follow-up with us  on an as-needed basis   Advised to contact the office if any questions or concerns arise.   Lyle Decamp PA-C Dept of Neurosurgery   "

## 2024-09-01 ENCOUNTER — Other Ambulatory Visit: Payer: Self-pay | Admitting: Family Medicine

## 2024-09-01 DIAGNOSIS — M5412 Radiculopathy, cervical region: Secondary | ICD-10-CM

## 2024-12-14 ENCOUNTER — Encounter: Admitting: Family Medicine
